# Patient Record
Sex: Male | Born: 1935 | Race: White | Hispanic: No | Marital: Married | Smoking: Former smoker
Health system: Southern US, Community
[De-identification: ages and names within clinical notes are randomized; demographics above are authoritative.]

## PROBLEM LIST (undated history)

## (undated) DIAGNOSIS — F419 Anxiety disorder, unspecified: Secondary | ICD-10-CM

## (undated) DIAGNOSIS — E119 Type 2 diabetes mellitus without complications: Secondary | ICD-10-CM

## (undated) DIAGNOSIS — E785 Hyperlipidemia, unspecified: Secondary | ICD-10-CM

## (undated) DIAGNOSIS — I10 Essential (primary) hypertension: Secondary | ICD-10-CM

## (undated) DIAGNOSIS — C61 Malignant neoplasm of prostate: Secondary | ICD-10-CM

## (undated) DIAGNOSIS — F32A Depression, unspecified: Secondary | ICD-10-CM

## (undated) DIAGNOSIS — F329 Major depressive disorder, single episode, unspecified: Secondary | ICD-10-CM

## (undated) DIAGNOSIS — D649 Anemia, unspecified: Secondary | ICD-10-CM

## (undated) DIAGNOSIS — G629 Polyneuropathy, unspecified: Secondary | ICD-10-CM

## (undated) HISTORY — DX: Anemia, unspecified: D64.9

## (undated) HISTORY — DX: Anxiety disorder, unspecified: F41.9

## (undated) HISTORY — DX: Depression, unspecified: F32.A

## (undated) HISTORY — DX: Hyperlipidemia, unspecified: E78.5

## (undated) HISTORY — DX: Malignant neoplasm of prostate: C61

## (undated) HISTORY — DX: Essential (primary) hypertension: I10

## (undated) HISTORY — DX: Polyneuropathy, unspecified: G62.9

## (undated) HISTORY — DX: Major depressive disorder, single episode, unspecified: F32.9

## (undated) HISTORY — DX: Type 2 diabetes mellitus without complications: E11.9

---

## 1973-03-12 HISTORY — PX: CHOLECYSTECTOMY: SHX55

## 2000-02-14 ENCOUNTER — Other Ambulatory Visit: Admission: RE | Admit: 2000-02-14 | Discharge: 2000-02-14 | Payer: Self-pay | Admitting: Urology

## 2000-03-12 HISTORY — PX: PROSTATE SURGERY: SHX751

## 2000-04-03 ENCOUNTER — Ambulatory Visit (HOSPITAL_COMMUNITY): Admission: RE | Admit: 2000-04-03 | Discharge: 2000-04-03 | Payer: Self-pay | Admitting: Gastroenterology

## 2000-08-28 ENCOUNTER — Other Ambulatory Visit: Admission: RE | Admit: 2000-08-28 | Discharge: 2000-08-28 | Payer: Self-pay | Admitting: Urology

## 2000-09-09 ENCOUNTER — Ambulatory Visit: Admission: RE | Admit: 2000-09-09 | Discharge: 2000-10-09 | Payer: Self-pay | Admitting: Radiation Oncology

## 2000-10-10 ENCOUNTER — Ambulatory Visit: Admission: RE | Admit: 2000-10-10 | Discharge: 2001-01-08 | Payer: Self-pay | Admitting: Radiation Oncology

## 2000-12-20 ENCOUNTER — Encounter: Payer: Self-pay | Admitting: Urology

## 2000-12-25 ENCOUNTER — Encounter: Admission: RE | Admit: 2000-12-25 | Discharge: 2000-12-25 | Payer: Self-pay | Admitting: Urology

## 2000-12-25 ENCOUNTER — Encounter: Payer: Self-pay | Admitting: Urology

## 2001-01-17 ENCOUNTER — Ambulatory Visit (HOSPITAL_COMMUNITY): Admission: RE | Admit: 2001-01-17 | Discharge: 2001-01-17 | Payer: Self-pay | Admitting: Urology

## 2001-02-10 ENCOUNTER — Ambulatory Visit: Admission: RE | Admit: 2001-02-10 | Discharge: 2001-05-11 | Payer: Self-pay | Admitting: Radiation Oncology

## 2001-04-28 ENCOUNTER — Ambulatory Visit (HOSPITAL_COMMUNITY): Admission: RE | Admit: 2001-04-28 | Discharge: 2001-04-28 | Payer: Self-pay | Admitting: Gastroenterology

## 2001-05-13 ENCOUNTER — Ambulatory Visit: Admission: RE | Admit: 2001-05-13 | Discharge: 2001-08-11 | Payer: Self-pay | Admitting: Radiation Oncology

## 2004-01-26 ENCOUNTER — Ambulatory Visit: Payer: Self-pay | Admitting: Hematology & Oncology

## 2004-09-18 ENCOUNTER — Ambulatory Visit: Payer: Self-pay | Admitting: Hematology & Oncology

## 2004-09-28 ENCOUNTER — Emergency Department (HOSPITAL_COMMUNITY): Admission: EM | Admit: 2004-09-28 | Discharge: 2004-09-28 | Payer: Self-pay | Admitting: Emergency Medicine

## 2011-12-21 ENCOUNTER — Other Ambulatory Visit: Payer: Self-pay | Admitting: Gastroenterology

## 2014-03-12 HISTORY — PX: CATARACT EXTRACTION: SUR2

## 2014-06-24 ENCOUNTER — Ambulatory Visit (INDEPENDENT_AMBULATORY_CARE_PROVIDER_SITE_OTHER): Payer: Medicare Other | Admitting: Podiatry

## 2014-06-24 ENCOUNTER — Ambulatory Visit (INDEPENDENT_AMBULATORY_CARE_PROVIDER_SITE_OTHER): Payer: Medicare Other

## 2014-06-24 ENCOUNTER — Encounter: Payer: Self-pay | Admitting: Podiatry

## 2014-06-24 VITALS — BP 154/71 | HR 73 | Resp 11 | Ht 71.0 in | Wt 155.0 lb

## 2014-06-24 DIAGNOSIS — M2040 Other hammer toe(s) (acquired), unspecified foot: Secondary | ICD-10-CM

## 2014-06-24 DIAGNOSIS — E1142 Type 2 diabetes mellitus with diabetic polyneuropathy: Secondary | ICD-10-CM

## 2014-06-24 DIAGNOSIS — L309 Dermatitis, unspecified: Secondary | ICD-10-CM

## 2014-06-24 MED ORDER — TRIAMCINOLONE ACETONIDE 0.025 % EX CREA: 1.0000 "application " | TOPICAL_CREAM | Freq: Two times a day (BID) | CUTANEOUS | Status: DC

## 2014-06-24 NOTE — Progress Notes (Signed)
   Subjective:    Patient ID: Clayton Richards, male    DOB: 1928-12-25, 81 y.o.   MRN: [id number]  HPI Comments: Pt complains of non-itching red, peeling rash to left plantar foot.     Review of Systems  All other systems reviewed and are negative.      Objective:   Physical Exam: He presents today vital signs stable alert and oriented 3 pulses are palpable bilateral. Slight decreased sensorium per Semmes-Weinstein monofilament. Deep tendon reflexes are intact bilateral and muscle strength +5 over 5 dorsiflexion plantar flexors and inverters and everters all intrinsic musculature is intact. Orthopedic evaluation demonstrates all joints distal to the ankle have full range of motion without crepitation. Radiographs demonstrate mild osteoarthritis midfoot bilateral. Also medial calcific sclerosis. Cutaneous evaluation demonstrates dry xerotic scaly skin to the plantar forefoot left appears to be an eczematous dermatitis also he has a fifth toenail plate that appears to be involved as well. His nails are thick yellow dystrophic onychomycotic and tender on palpation as well as debridement.        Assessment & Plan:  Assessment: Diabetes mellitus with diabetic peripheral neuropathy and dermatitis.  Plan: Wrote a prescription for triamcinolone cream. We'll follow-up with him as needed.

## 2015-12-21 ENCOUNTER — Other Ambulatory Visit: Payer: Self-pay | Admitting: Physician Assistant

## 2016-08-08 ENCOUNTER — Ambulatory Visit (INDEPENDENT_AMBULATORY_CARE_PROVIDER_SITE_OTHER): Payer: Medicare Other | Admitting: Neurology

## 2016-08-08 ENCOUNTER — Encounter (INDEPENDENT_AMBULATORY_CARE_PROVIDER_SITE_OTHER): Payer: Self-pay

## 2016-08-08 ENCOUNTER — Encounter: Payer: Self-pay | Admitting: Neurology

## 2016-08-08 VITALS — Ht 71.0 in | Wt 150.6 lb

## 2016-08-08 DIAGNOSIS — M21371 Foot drop, right foot: Secondary | ICD-10-CM | POA: Diagnosis not present

## 2016-08-08 DIAGNOSIS — M6281 Muscle weakness (generalized): Secondary | ICD-10-CM

## 2016-08-08 DIAGNOSIS — E119 Type 2 diabetes mellitus without complications: Secondary | ICD-10-CM | POA: Insufficient documentation

## 2016-08-08 DIAGNOSIS — M21372 Foot drop, left foot: Secondary | ICD-10-CM | POA: Diagnosis not present

## 2016-08-08 DIAGNOSIS — M48062 Spinal stenosis, lumbar region with neurogenic claudication: Secondary | ICD-10-CM | POA: Diagnosis not present

## 2016-08-08 DIAGNOSIS — R200 Anesthesia of skin: Secondary | ICD-10-CM | POA: Diagnosis not present

## 2016-08-08 DIAGNOSIS — G608 Other hereditary and idiopathic neuropathies: Secondary | ICD-10-CM | POA: Insufficient documentation

## 2016-08-08 DIAGNOSIS — G629 Polyneuropathy, unspecified: Secondary | ICD-10-CM | POA: Diagnosis not present

## 2016-08-08 NOTE — Patient Instructions (Signed)
Remember to drink plenty of fluid, eat healthy meals and do not skip any meals. Try to eat protein with a every meal and eat a healthy snack such as fruit or nuts in between meals. Try to keep a regular sleep-wake schedule and try to exercise daily, particularly in the form of walking, 20-30 minutes a day, if you can.   As far as diagnostic testing: Lab testing today EMG/NCS to evaluate for myopathy or myositis MRI of the lumbar spine to evaluate for lumbar spinal stenosis  I would like to see you back in for emg/ncs , sooner if we need to. Please call us with any interim questions, concerns, problems, updates or refill requests.   Our phone number is [phone]. We also have an after hours call service for urgent matters and there is a physician on-call for urgent questions. For any emergencies you know to call 911 or go to the nearest emergency room   Myopathy Myopathy is a condition that causes muscles to be weak and dysfunctional. There are many different kinds of myopathies. Myopathies may be passed from parent to child (inherited), or they may be caused by external factors (acquired). Inherited myopathies may cause symptoms at birth or in early life. Acquired myopathies may start suddenly at any age. There is no cure for most myopathies. What are the causes? Causes of myopathy include:  Endocrine disorders, such as thyroid disease.  Metabolic disorders, which are usually inherited.  Infection or inflammation of the muscles. This is often triggered by viruses or because the body's defense system (immune system) is attacking the muscles.  Certain medicines, such as lipid-lowering medicines. In some cases, the cause is not known. What increases the risk? This condition is more likely to develop in:  People who have a family history of myopathy.  Women. What are the signs or symptoms? Symptoms of myopathy can range from mild to severe. The most common symptoms include muscle  weakness, cramps, stiffness, and spasms. These symptoms are usually felt close to the center of the body (proximal). Depending upon the type of myopathy, one muscle group may be more affected than others. In inherited myopathies, symptoms vary among family members. Other symptoms of myopathy include:  Muscle pain or tenderness.  Muscle weakness that gets progressively worse.  Fatigue.  Heart problems.  Trouble breathing.  Trouble swallowing. How is this diagnosed? This condition is diagnosed based on your medical history and tests, which may include:  Blood tests.  Removal of a small piece of muscle tissue to be tested (biopsy).  Electromyogram (EMG).  MRI.  Electrocardiogram (ECG).  Genetic testing. How is this treated? Treatment varies depending on the type of myopathy. Treatment may include over-the-counter or prescription medicines, such as disease-modifying antirheumatic drugs (DMARDs) or drugs that suppress the immune system. You may need physical therapy, a brace to stabilize your muscles, or surgery. Follow these instructions at home: If you have a brace:   Wear the brace as told by your health care provider. Remove it only as told by your health care provider.  Loosen the brace if any part of your body tingles, becomes numb, or turns cold and blue.  Do not let your brace get wet if it is not waterproof. If it is not waterproof, cover it with a watertight plastic bag when you take a bath or a shower.  Keep the brace clean.  Ask your health care provider when it is safe to drive if you are wearing a brace. General  instructions   Take over-the-counter and prescription medicines only as told by your health care provider.  Maintain a healthy weight. Follow instructions from your health care provider about eating, drinking, and physical activity.  If physical therapy is prescribed, do exercises as told by your health care provider or physical therapist.  Keep all  follow-up visits as told by your health care provider. This is important. Contact a health care provider if:  You have trouble managing your symptoms at home.  You have a fever. Get help right away if:  You develop breathing problems.  You develop chest pain. This information is not intended to replace advice given to you by your health care provider. Make sure you discuss any questions you have with your health care provider. Document Released: 02/16/2002 Document Revised: 08/04/2015 Document Reviewed: 09/08/2014 Elsevier Interactive Patient Education  2017 [place].   Spinal Stenosis Spinal stenosis occurs when the open space (spinal canal) between the bones of your spine (vertebrae) narrows, putting pressure on the spinal cord or nerves. What are the causes? This condition is caused by areas of bone pushing into the central canals of your vertebrae. This condition may be present at birth (congenital), or it may be caused by:  Arthritic deterioration of your vertebrae (spinal degeneration). This usually starts around age 41.  Injury or trauma to the spine.  Tumors in the spine.  Calcium deposits in the spine. What are the signs or symptoms? Symptoms of this condition include:  Pain in the neck or back that is generally worse with activities, particularly when standing and walking.  Numbness, tingling, hot or cold sensations, weakness, or weariness in your legs.  Pain going up and down the leg (sciatica).  Frequent episodes of falling.  A foot-slapping gait that leads to muscle weakness. In more serious cases, you may develop:  Problemspassing stool or passing urine.  Difficulty having sex.  Loss of feeling in part or all of your leg. Symptoms may come on slowly and get worse over time. How is this diagnosed? This condition is diagnosed based on your medical history and a physical exam. Tests will also be done, such as:  MRI.  CT scan.  X-ray. How is  this treated? Treatment for this condition often focuses on managing your pain and any other symptoms. Treatment may include:  Practicing good posture to lessen pressure on your nerves.  Exercising to strengthen muscles, build endurance, improve balance, and maintain good joint movement (range of motion).  Losing weight, if needed.  Taking medicines to reduce swelling, inflammation, or pain.  Assistive devices, such as a corset or brace. In some cases, surgery may be needed. The most common procedure is decompression laminectomy. This is done to remove excess bone that puts pressure on your nerve roots. Follow these instructions at home: Managing pain, stiffness, and swelling   Do all exercises and stretches as told by your health care provider.  Practice good posture. If you were given a brace or a corset, wear it as told by your health care provider.  Do not do any activities that cause pain. Ask your health care provider what activities are safe for you.  Do not lift anything that is heavier than 10 lb (4.5 kg) or the limit that your health care provider tells you.  Maintain a healthy weight. Talk with your health care provider if you need help losing weight.  If directed, apply heat to the affected area as often as told by your health  care provider. Use the heat source that your health care provider recommends, such as a moist heat pack or a heating pad.  Place a towel between your skin and the heat source.  Leave the heat on for 20-30 minutes.  Remove the heat if your skin turns bright red. This is especially important if you are not able to feel pain, heat, or cold. You may have a greater risk of getting burned. General instructions   Take over-the-counter and prescription medicines only as told by your health care provider.  Do not use any products that contain nicotine or tobacco, such as cigarettes and e-cigarettes. If you need help quitting, ask your health care  provider.  Eat a healthy diet. This includes plenty of fruits and vegetables, whole grains, and low-fat (lean) protein.  Keep all follow-up visits as told by your health care provider. This is important. Contact a health care provider if:  Your symptoms do not get better or they get worse.  You have a fever. Get help right away if:  You have new or worse pain in your neck or upper back.  You have severe pain that cannot be controlled with medicines.  You are dizzy.  You have vision problems, blurred vision, or double vision.  You have a severe headache that is worse when you stand.  You have nausea or you vomit.  You develop new or worse numbness or tingling in your back or legs.  You have pain, redness, swelling, or warmth in your arm or leg. Summary  Spinal stenosis occurs when the open space (spinal canal) between the bones of your spine (vertebrae) narrows. This narrowing puts pressure on the spinal cord or nerves.  Spinal stenosis can cause numbness, weakness, or pain in the neck, back, and legs.  This condition may be caused by a birth defect, arthritic deterioration of your vertebrae, injury, tumors, or calcium deposits.  This condition is usually diagnosed with MRIs, CT scans, and X-rays. This information is not intended to replace advice given to you by your health care provider. Make sure you discuss any questions you have with your health care provider. Document Released: 05/19/2003 Document Revised: 02/01/2016 Document Reviewed: 02/01/2016 Elsevier Interactive Patient Education  2017 [employer].

## 2016-08-08 NOTE — Progress Notes (Signed)
[facility]    Provider:  Dr Clayton Richards Referring Provider: Chesley Noon, MD Primary Care Physician:  Clayton Noon, MD  CC:  Peripheral neuropathy  HPI:  Clayton Richards is a 83 y.o. male here as a referral from Dr. Melford Richards for peripheral neuropathy. Patient has a past medical history of diabetes type 2(since 1988), hypertension, hyperlipidemia, migraines. Was diagnosed with neuropathy 15 years ago getting worse within the last year and weakness in hs legs. He has no pain at all. He has numbness in his feet but this isn't the significant problem. He feels his legs are weaker. Progressive weakness for over a year. Sloly progressive. He can go up and down steps holding onto the rail. But he has difficulty getting out of low seats without using his hands. His arms are fine, no weakness. Just his legs. He goes to the gym several times a week. He sees his doctor every 6 months. Numbness goes up to his shins. No dragging of his feet, more proximal weakness. No muscle pain. No muscle twitching or cramps. No bulbar symptoms, no ptosis or vision changes. No rashes. No family history of neuromuscular disorders or neuropathy. He has dry mouth but no dry eyes. No back or neck pain. He can't walk long distances without sitting, can;t stand still has to sit down. Leaning on a cart makes it better.   Reviewed notes, labs and imaging from outside physicians, which showed;  Hgba1c 6.8 04/16/2016  This is an 83 year old patient here for peripheral neuropathy. Personally reviewed primary care notes. Symptoms ongoing for more than a year. Associated peripheral edema. No history of chronic renal disease. Patient has diabetes. His disease course has been stable. Diabetic complications include peripheral neuropathy. Patient is on diabetic triple therapy. Also has hyperlipidemia. Reviewed notes and exam which showed normal head, ears, nose, throat, head, ears, eyes, cardiovascular, pulmonary,  neurologic, skin, psychiatric. Diabetic foot exam showed normal sensation on monofilament test bilaterally, skin normal without erythema or cyanosis or pallor. Patient was prescribed tramadol as needed. Also diagnosed with idiopathic trigeminal neuropathy. He has back pain, feels better to sit down.   Reviewed reports XR of the foot right and left:  3 views of the right foot in stress osseously mature foot rectus in nature appears to have had some work to the first metatarsal right foot. Mild hammertoe deformities are noted. No fractures identified.  3 views of the left foot there was trace osseously mature foot rectus in nature. Mild osteoarthritic hammertoes are noted left foot. No osseous abnormalities.  Review of Systems: Patient complains of symptoms per HPI as well as the following symptoms: numbness, weakness, no rash, no fever. Pertinent negatives and positives per HPI. All others negative.   Social History   Social History  . Marital status: Married    Spouse name: Clayton Richards  . Number of children: 3  . Years of education: 14   Occupational History  . Retired    Social History Main Topics  . Smoking status: Former Research scientist (life sciences)  . Smokeless tobacco: Never Used  . Alcohol use No  . Drug use: No  . Sexual activity: Not on file   Other Topics Concern  . Not on file   Social History Narrative   Lives at home w/ his wife   Right-handed   Caffeine: occasional decaf coffee    No family history on file.  Past Medical History:  Diagnosis Date  . Anemia   . Anxiety   .  Depression   . Diabetes mellitus without complication ([place])   . Hyperlipidemia   . Hypertension   . Neuropathy   . Prostate CA [facility])     Past Surgical History:  Procedure Laterality Date  . CATARACT EXTRACTION  2016  . CHOLECYSTECTOMY  1975  . PROSTATE SURGERY  2002    Current Outpatient Prescriptions  Medication Sig Dispense Refill  . amLODipine-atorvastatin (CADUET) 5-20 MG per tablet Take 1 tablet by  mouth daily.    Marland Kitchen aspirin 81 MG tablet Take 81 mg by mouth daily.    Marland Kitchen buPROPion (WELLBUTRIN XL) 300 MG 24 hr tablet Take 300 mg by mouth daily.    . calcium carbonate (OS-CAL) 600 MG TABS tablet Take 600 mg by mouth 2 (two) times daily with a meal.    . Cholecalciferol (D3 ADULT PO) Take by mouth.    . ferrous sulfate 325 (65 FE) MG tablet Take 325 mg by mouth daily with breakfast.    . glimepiride (AMARYL) 4 MG tablet Take 4 mg by mouth daily with breakfast.    . metFORMIN (GLUCOPHAGE) 1000 MG tablet Take 1,000 mg by mouth 2 (two) times daily with a meal.    . Omega-3 Fatty Acids (OMEGA-3 FISH OIL PO) Take by mouth.    . sitaGLIPtin (JANUVIA) 50 MG tablet Take 50 mg by mouth daily.    Marland Kitchen triamcinolone (KENALOG) 0.025 % cream Apply 1 application topically 2 (two) times daily. 30 g 3  . valsartan-hydrochlorothiazide (DIOVAN-HCT) 320-12.5 MG per tablet Take 1 tablet by mouth daily.     No current facility-administered medications for this visit.     Allergies as of 08/08/2016  . (No Known Allergies)    Vitals: Ht 5' 11" (1.803 m)   Wt 150 lb 9.6 oz (68.3 kg)   BMI 21.00 kg/m  Last Weight:  Wt Readings from Last 1 Encounters:  08/08/16 150 lb 9.6 oz (68.3 kg)   Last Height:   Ht Readings from Last 1 Encounters:  08/08/16 5' 11" (1.803 m)    Physical exam: Exam: Gen: NAD, conversant                    CV: RRR, no MRG. No Carotid Bruits. No peripheral edema, warm, nontender Eyes: Conjunctivae clear without exudates or hemorrhage  Neuro: Detailed Neurologic Exam  Speech:    Speech is normal; fluent and spontaneous with normal comprehension.  Cognition:    The patient is oriented to person, place, and time;     recent and remote memory intact;     language fluent;     normal attention, concentration,     fund of knowledge Cranial Nerves:    The pupils are equal, round, and reactive to light. Attempted fundoscopic exam could not visualize. Visual fields are full to finger  confrontation. Extraocular movements are intact. Trigeminal sensation is intact and the muscles of mastication are normal. The face is symmetric. The palate elevates in the midline. Hearing intact. Voice is normal. Shoulder shrug is normal. The tongue has normal motion without fasciculations.   Coordination:    No dysmetria  Gait:    Wide based and mildly stoooped with a foot slapping gait  Motor Observation:    no involuntary movements noted. Tone:    Decreased muscle tone distal lower extremities  Posture:    Slightly stooped    Strength: Triceps and hip flexors: 4/5 bilaterally. foot DF: left 3/5, right 2+/5. mild foot inversion and eversion weakness  Otherwise strength is V/V in the upper and lower limbs.      Sensation: dec pinprick to mid calf, , absent vibration to the knees, absent proprioception at the great toes.      Reflex Exam:  DTR's:    Absent AJs otherwise deep tendon reflexes in the upper and lower extremities are normal bilaterally.   Toes:    The toes are downgoing bilaterally.   Clonus:    Clonus is absent.      Assessment/Plan:  83 year old male with 15 years of distal peripheral polyneuropathy now with reported proximal weakness.  Exam shows proximal upper and lower extremity weakness, distal distal LE weakness with bilateral foot drop, and decrease in all sensory modalities distally in the LE. The distal polyneuropathy may be diabetic in etiology however we'll perform a thorough serum screen for other common causes of treatable length dependent polyneuropathy. The distal weakness in his LE may be secondary to his polyneuropathy but need to consider other causes such as lumbar spinal stenosis given his neurogenic claudication. For his proximal weakness need to rule out myopathy or myositis, myasthenia gravis and other etiologies . Will check muscle enzymes as well.   Emg/ncs of the right arm and right leg for evaluation of myopathy/myositis or other  etiologies of proximal and distal weakness MRI lumbar spine to evaluate for spinal stenosis and surgical options Lab testing for causes of length-dependent sensorimotor neuropathy  Cc:  Clayton Noon, MD   Orders Placed This Encounter  Procedures  . MR LUMBAR SPINE WO CONTRAST  . Sjogren's syndrome antibods(ssa + ssb)  . B12 and Folate Panel  . Heavy metals, blood  . Vitamin B6  . Multiple Myeloma Panel (SPEP&IFE w/QIG)  . B. burgdorfi Antibody  . Vitamin B1  . Methylmalonic acid, serum  . CK  . Acetylcholine receptor, binding  . Acetylcholine receptor, blocking  . Acetylcholine receptor, modulating  . VGCC Antibody  . TSH  . NCV with EMG(electromyography)    Sarina Ill, MD  [facility] [address] [place], [place] [address]  Phone [phone] Fax [phone]

## 2016-08-14 LAB — VGCC ANTIBODY: VGCC Antibody: NEGATIVE

## 2016-08-14 LAB — MULTIPLE MYELOMA PANEL, SERUM
ALPHA2 GLOB SERPL ELPH-MCNC: 1 g/dL (ref 0.4–1.0)
Albumin SerPl Elph-Mcnc: 3.7 g/dL (ref 2.9–4.4)
Albumin/Glob SerPl: 1.1 (ref 0.7–1.7)
Alpha 1: 0.2 g/dL (ref 0.0–0.4)
B-Globulin SerPl Elph-Mcnc: 1.1 g/dL (ref 0.7–1.3)
Gamma Glob SerPl Elph-Mcnc: 1.1 g/dL (ref 0.4–1.8)
Globulin, Total: 3.5 g/dL (ref 2.2–3.9)
IGG (IMMUNOGLOBIN G), SERUM: 1149 mg/dL (ref 700–1600)
IgA/Immunoglobulin A, Serum: 211 mg/dL (ref 61–437)
IgM (Immunoglobulin M), Srm: 29 mg/dL (ref 15–143)
Total Protein: 7.2 g/dL (ref 6.0–8.5)

## 2016-08-14 LAB — B12 AND FOLATE PANEL
FOLATE: 14.1 ng/mL (ref >3.0)
VITAMIN B 12: 887 pg/mL (ref 232–1245)

## 2016-08-14 LAB — METHYLMALONIC ACID, SERUM: METHYLMALONIC ACID: 179 nmol/L (ref 0–378)

## 2016-08-14 LAB — ACETYLCHOLINE RECEPTOR, BLOCKING: Acetylchol Block Ab: 16 % (ref 0–25)

## 2016-08-14 LAB — HEAVY METALS, BLOOD
Arsenic: 7 ug/L (ref 2–23)
LEAD, BLOOD: 1 ug/dL (ref 0–19)
Mercury: NOT DETECTED ug/L (ref 0.0–14.9)

## 2016-08-14 LAB — CK: Total CK: 103 U/L (ref 24–204)

## 2016-08-14 LAB — VITAMIN B6: Vitamin B6: 8.3 ug/L (ref 5.3–46.7)

## 2016-08-14 LAB — TSH: TSH: 1.71 u[IU]/mL (ref 0.450–4.500)

## 2016-08-14 LAB — ACETYLCHOLINE RECEPTOR, MODULATING: Acetylcholine Modulat Ab: <12 % (ref 0–20)

## 2016-08-14 LAB — VITAMIN B1: THIAMINE: 113.4 nmol/L (ref 66.5–200.0)

## 2016-08-14 LAB — B. BURGDORFI ANTIBODIES

## 2016-08-14 LAB — SJOGREN'S SYNDROME ANTIBODS(SSA + SSB)

## 2016-08-14 LAB — ACETYLCHOLINE RECEPTOR, BINDING

## 2016-08-21 ENCOUNTER — Other Ambulatory Visit: Payer: Self-pay | Admitting: Neurology

## 2016-08-21 ENCOUNTER — Ambulatory Visit
Admission: RE | Admit: 2016-08-21 | Discharge: 2016-08-21 | Disposition: A | Discharge status: Home / Self Care | Payer: Medicare Other | Source: Ambulatory Visit | Attending: Neurology | Admitting: Neurology

## 2016-08-21 DIAGNOSIS — R200 Anesthesia of skin: Secondary | ICD-10-CM

## 2016-08-21 DIAGNOSIS — M48062 Spinal stenosis, lumbar region with neurogenic claudication: Secondary | ICD-10-CM | POA: Diagnosis not present

## 2016-08-21 DIAGNOSIS — G629 Polyneuropathy, unspecified: Secondary | ICD-10-CM

## 2016-08-21 DIAGNOSIS — M3500 Sicca syndrome, unspecified: Secondary | ICD-10-CM

## 2016-08-21 DIAGNOSIS — M21372 Foot drop, left foot: Secondary | ICD-10-CM

## 2016-08-21 DIAGNOSIS — M21371 Foot drop, right foot: Secondary | ICD-10-CM

## 2016-08-21 DIAGNOSIS — M6281 Muscle weakness (generalized): Secondary | ICD-10-CM

## 2016-08-22 ENCOUNTER — Other Ambulatory Visit: Payer: Self-pay | Admitting: Neurology

## 2016-08-22 DIAGNOSIS — M48062 Spinal stenosis, lumbar region with neurogenic claudication: Secondary | ICD-10-CM

## 2016-08-23 ENCOUNTER — Telehealth: Payer: Self-pay | Admitting: Neurology

## 2016-08-23 NOTE — Telephone Encounter (Signed)
Called Dr. Estanislado Pandy  Office and her assistant relayed to fax notes and labs and Dr. Estanislado Pandy would determine the urgency of patient coming in she reviews all her records referral has been sent. Telephone [phone] - fax [phone]. Marland Kitchen     Please refer to Rheumatology (preferrably Dr. Patrecia Pour  for Sjogren's Syndrome antibidoes for ssa and ssb both significantly elevated > 8. Please send my office note and very important send Sjogern's labs results and all labs. Hinton Dyer, would you call and see when Dr. Keturah Barre has availability or if we can get him in sooner with another rheumatologist? His antibodies are really high and he is having difficulty swallowing thanks.

## 2016-08-23 NOTE — Telephone Encounter (Signed)
That's fine. thanks

## 2016-09-13 ENCOUNTER — Encounter: Payer: Medicare Other | Admitting: Neurology

## 2016-09-14 ENCOUNTER — Telehealth: Payer: Self-pay | Admitting: Neurology

## 2016-09-14 NOTE — Telephone Encounter (Signed)
Patient called office in reference to being referred to rheumatologist.  Patient states he has not heard anything about an appointment.  Please call

## 2016-09-14 NOTE — Telephone Encounter (Signed)
Clayton Richards, Can we refer him to anohter rhaumatologist? No idea why she declined him, make sure we send his Sjogren's labs along with the referral showing Sjogren's disease.

## 2016-09-14 NOTE — Telephone Encounter (Signed)
Per notes, referral was declined per Dr. Estanislado Pandy.

## 2016-09-18 NOTE — Telephone Encounter (Signed)
Called Dr. Melissa Noon office telephone . Telephone [phone] - fax [phone]. Dr. Amil Amen can take patient on referral faxed with labs.

## 2016-09-26 ENCOUNTER — Other Ambulatory Visit: Payer: Self-pay | Admitting: Neurosurgery

## 2016-10-01 ENCOUNTER — Telehealth: Payer: Self-pay

## 2016-10-01 NOTE — Telephone Encounter (Signed)
Received 09/24/16 consult notes from [facility]. Dr. Arneta Cliche says, "Given the fact that he has weakness in his legs... And the severity of his stenosis, I do not think there is any other choice other than to offer him a decompression and, unfortunately, a possible fusion secondary to the severe facet arthropathy that he has on the left side at the 4-5 joint. I would do a limited amount of work... He understands and will contact me to let me know what it is that he would like to do." Sent to med records for scanning, copy to Dr. Jaynee Eagles for review.

## 2016-10-25 NOTE — Telephone Encounter (Signed)
Received faxed progress notes from [facility]. Dr. Trudie Reed advised that a positive lab (ssa + ssb) does not necessarily mean he has Sjogren syndrome but it's possible w/ his dry mouth... Advised that it would be difficult to know if neuropathy is r/t Sjogren's and he is not having any significant symptoms r/t this... Do not think it would be beneficial for him to have a nerve biopsy or even consider the significant immunosuppression that would be required to treat a neuropathy from Sjogren's." Sent to med records for scanning, copy to Dr. Jaynee Eagles for review.

## 2016-11-07 ENCOUNTER — Inpatient Hospital Stay: Admit: 2016-11-07 | Payer: Medicare Other | Admitting: Neurosurgery

## 2016-11-07 SURGERY — POSTERIOR LUMBAR FUSION 1 LEVEL
Anesthesia: General

## 2017-01-22 ENCOUNTER — Telehealth: Payer: Self-pay | Admitting: Hematology and Oncology

## 2017-01-22 ENCOUNTER — Encounter: Payer: Self-pay | Admitting: Hematology and Oncology

## 2017-01-22 NOTE — Telephone Encounter (Addendum)
Appt has been scheduled for the pt to see Dr. Lebron Conners on 12/3 at 2pm. Pt aware to arrive 30 minutes early. Address and insurance verified. Letter mailed to the pt.

## 2017-01-22 NOTE — Telephone Encounter (Deleted)
Correction pt is scheduled for 12/5, not 12/3

## 2017-02-13 ENCOUNTER — Ambulatory Visit: Payer: Medicare Other | Admitting: Hematology and Oncology

## 2017-02-13 ENCOUNTER — Ambulatory Visit (HOSPITAL_BASED_OUTPATIENT_CLINIC_OR_DEPARTMENT_OTHER): Payer: Medicare Other

## 2017-02-13 ENCOUNTER — Telehealth: Payer: Self-pay | Admitting: Hematology and Oncology

## 2017-02-13 ENCOUNTER — Encounter: Payer: Self-pay | Admitting: Hematology and Oncology

## 2017-02-13 DIAGNOSIS — D696 Thrombocytopenia, unspecified: Secondary | ICD-10-CM

## 2017-02-13 DIAGNOSIS — I10 Essential (primary) hypertension: Secondary | ICD-10-CM | POA: Diagnosis not present

## 2017-02-13 DIAGNOSIS — D72819 Decreased white blood cell count, unspecified: Secondary | ICD-10-CM

## 2017-02-13 DIAGNOSIS — G629 Polyneuropathy, unspecified: Secondary | ICD-10-CM

## 2017-02-13 DIAGNOSIS — D72810 Lymphocytopenia: Secondary | ICD-10-CM | POA: Diagnosis not present

## 2017-02-13 DIAGNOSIS — D649 Anemia, unspecified: Secondary | ICD-10-CM | POA: Diagnosis not present

## 2017-02-13 DIAGNOSIS — E119 Type 2 diabetes mellitus without complications: Secondary | ICD-10-CM | POA: Diagnosis not present

## 2017-02-13 DIAGNOSIS — M3500 Sicca syndrome, unspecified: Secondary | ICD-10-CM

## 2017-02-13 DIAGNOSIS — R209 Unspecified disturbances of skin sensation: Secondary | ICD-10-CM

## 2017-02-13 LAB — COMPREHENSIVE METABOLIC PANEL
ALT: 24 U/L (ref 0–55)
ANION GAP: 13 meq/L — AB (ref 3–11)
AST: 29 U/L (ref 5–34)
Albumin: 3.7 g/dL (ref 3.5–5.0)
Alkaline Phosphatase: 71 U/L (ref 40–150)
BILIRUBIN TOTAL: 0.46 mg/dL (ref 0.20–1.20)
BUN: 25.1 mg/dL (ref 7.0–26.0)
CHLORIDE: 107 meq/L (ref 98–109)
CO2: 19 meq/L — AB (ref 22–29)
Calcium: 9.7 mg/dL (ref 8.4–10.4)
Creatinine: 1.5 mg/dL — ABNORMAL HIGH (ref 0.7–1.3)
EGFR: 43 mL/min/{1.73_m2} — AB (ref >60)
Glucose: 64 mg/dl — ABNORMAL LOW (ref 70–140)
POTASSIUM: 4.6 meq/L (ref 3.5–5.1)
Sodium: 139 mEq/L (ref 136–145)
Total Protein: 7.6 g/dL (ref 6.4–8.3)

## 2017-02-13 LAB — URIC ACID: URIC ACID, SERUM: 8.5 mg/dL — AB (ref 2.6–7.4)

## 2017-02-13 LAB — MORPHOLOGY: PLT EST: ADEQUATE

## 2017-02-13 LAB — CBC & DIFF AND RETIC
BASO%: 0.3 % (ref 0.0–2.0)
Basophils Absolute: 0 10*3/uL (ref 0.0–0.1)
EOS ABS: 0 10*3/uL (ref 0.0–0.5)
EOS%: 0 % (ref 0.0–7.0)
HCT: 30.1 % — ABNORMAL LOW (ref 38.4–49.9)
HEMOGLOBIN: 9.3 g/dL — AB (ref 13.0–17.1)
IMMATURE RETIC FRACT: 19.8 % — AB (ref 3.00–10.60)
LYMPH#: 0.7 10*3/uL — AB (ref 0.9–3.3)
LYMPH%: 22 % (ref 14.0–49.0)
MCH: 29.1 pg (ref 27.2–33.4)
MCHC: 30.9 g/dL — ABNORMAL LOW (ref 32.0–36.0)
MCV: 94.1 fL (ref 79.3–98.0)
MONO#: 0.8 10*3/uL (ref 0.1–0.9)
MONO%: 26 % — ABNORMAL HIGH (ref 0.0–14.0)
NEUT%: 51.7 % (ref 39.0–75.0)
NEUTROS ABS: 1.5 10*3/uL (ref 1.5–6.5)
Platelets: 229 10*3/uL (ref 140–400)
RBC: 3.2 10*6/uL — AB (ref 4.20–5.82)
RDW: 16.7 % — ABNORMAL HIGH (ref 11.0–14.6)
RETIC CT ABS: 64 10*3/uL (ref 34.80–93.90)
Retic %: 2 % — ABNORMAL HIGH (ref 0.80–1.80)
WBC: 3 10*3/uL — AB (ref 4.0–10.3)

## 2017-02-13 LAB — LACTATE DEHYDROGENASE: LDH: 216 U/L (ref 125–245)

## 2017-02-13 NOTE — Telephone Encounter (Signed)
Gave calendar for January 2019

## 2017-02-14 LAB — IRON AND TIBC
%SAT: 13 % — AB (ref 20–55)
IRON: 40 ug/dL — AB (ref 42–163)
TIBC: 318 ug/dL (ref 202–409)
UIBC: 277 ug/dL (ref 117–376)

## 2017-02-14 LAB — CERULOPLASMIN: CERULOPLASMIN: 30.1 mg/dL (ref 16.0–31.0)

## 2017-02-14 LAB — TESTOSTERONE, FREE, TOTAL, SHBG
SEX HORMONE BINDING: 157.6 nmol/L — AB (ref 19.3–76.4)
TESTOSTERONE FREE: 1.2 pg/mL — AB (ref 6.6–18.1)
Testosterone, Serum: 31 ng/dL — ABNORMAL LOW (ref 264–916)

## 2017-02-14 LAB — TSH: TSH: 2.172 m[IU]/L (ref 0.320–4.118)

## 2017-02-14 LAB — FOLATE: Folate: 13.1 ng/mL (ref >3.0)

## 2017-02-14 LAB — ERYTHROPOIETIN: ERYTHROPOIETIN: 22.8 m[IU]/mL — AB (ref 2.6–18.5)

## 2017-02-14 LAB — VITAMIN B12: Vitamin B12: >2000 pg/mL — ABNORMAL HIGH (ref 232–1245)

## 2017-02-14 LAB — FERRITIN: Ferritin: 140 ng/ml (ref 22–316)

## 2017-02-15 LAB — PROTEIN ELECTROPHORESIS, SERUM, WITH REFLEX
A/G RATIO SPE: 0.9 (ref 0.7–1.7)
ALBUMIN: 3.4 g/dL (ref 2.9–4.4)
Alpha 1: 0.3 g/dL (ref 0.0–0.4)
Alpha 2: 1.2 g/dL — ABNORMAL HIGH (ref 0.4–1.0)
Beta: 1.2 g/dL (ref 0.7–1.3)
GAMMA GLOBULIN: 1.2 g/dL (ref 0.4–1.8)
Globulin, Total: 3.8 g/dL (ref 2.2–3.9)
TOTAL PROTEIN: 7.2 g/dL (ref 6.0–8.5)

## 2017-02-15 LAB — COPPER, SERUM: COPPER: 123 ug/dL (ref 72–166)

## 2017-02-16 LAB — METHYLMALONIC ACID, SERUM: Methylmalonic Acid, Serum: 172 nmol/L (ref 0–378)

## 2017-02-17 LAB — VITAMIN B6: Vitamin B6: 3.9 ug/L — ABNORMAL LOW (ref 5.3–46.7)

## 2017-03-05 ENCOUNTER — Other Ambulatory Visit: Payer: Self-pay | Admitting: Radiology

## 2017-03-07 ENCOUNTER — Encounter (HOSPITAL_COMMUNITY): Payer: Self-pay

## 2017-03-07 ENCOUNTER — Ambulatory Visit (HOSPITAL_COMMUNITY)
Admission: RE | Admit: 2017-03-07 | Discharge: 2017-03-07 | Disposition: A | Discharge status: Home / Self Care | Payer: Medicare Other | Source: Ambulatory Visit | Attending: Hematology and Oncology | Admitting: Hematology and Oncology

## 2017-03-07 DIAGNOSIS — Z9889 Other specified postprocedural states: Secondary | ICD-10-CM | POA: Diagnosis not present

## 2017-03-07 DIAGNOSIS — F419 Anxiety disorder, unspecified: Secondary | ICD-10-CM | POA: Diagnosis not present

## 2017-03-07 DIAGNOSIS — I10 Essential (primary) hypertension: Secondary | ICD-10-CM | POA: Insufficient documentation

## 2017-03-07 DIAGNOSIS — F329 Major depressive disorder, single episode, unspecified: Secondary | ICD-10-CM | POA: Insufficient documentation

## 2017-03-07 DIAGNOSIS — E1140 Type 2 diabetes mellitus with diabetic neuropathy, unspecified: Secondary | ICD-10-CM | POA: Insufficient documentation

## 2017-03-07 DIAGNOSIS — D7589 Other specified diseases of blood and blood-forming organs: Secondary | ICD-10-CM | POA: Diagnosis not present

## 2017-03-07 DIAGNOSIS — D649 Anemia, unspecified: Secondary | ICD-10-CM | POA: Diagnosis present

## 2017-03-07 DIAGNOSIS — D72810 Lymphocytopenia: Secondary | ICD-10-CM | POA: Diagnosis not present

## 2017-03-07 DIAGNOSIS — D72819 Decreased white blood cell count, unspecified: Secondary | ICD-10-CM | POA: Insufficient documentation

## 2017-03-07 DIAGNOSIS — E785 Hyperlipidemia, unspecified: Secondary | ICD-10-CM | POA: Insufficient documentation

## 2017-03-07 DIAGNOSIS — Z8546 Personal history of malignant neoplasm of prostate: Secondary | ICD-10-CM | POA: Diagnosis not present

## 2017-03-07 DIAGNOSIS — Z9049 Acquired absence of other specified parts of digestive tract: Secondary | ICD-10-CM | POA: Diagnosis not present

## 2017-03-07 DIAGNOSIS — Z87891 Personal history of nicotine dependence: Secondary | ICD-10-CM | POA: Insufficient documentation

## 2017-03-07 LAB — CBC WITH DIFFERENTIAL/PLATELET
BASOS PCT: 0 %
Basophils Absolute: 0 10*3/uL (ref 0.0–0.1)
EOS PCT: 0 %
Eosinophils Absolute: 0 10*3/uL (ref 0.0–0.7)
HEMATOCRIT: 28.6 % — AB (ref 39.0–52.0)
HEMOGLOBIN: 9.1 g/dL — AB (ref 13.0–17.0)
LYMPHS PCT: 24 %
Lymphs Abs: 0.8 10*3/uL (ref 0.7–4.0)
MCH: 29.7 pg (ref 26.0–34.0)
MCHC: 31.8 g/dL (ref 30.0–36.0)
MCV: 93.5 fL (ref 78.0–100.0)
MONOS PCT: 31 %
Monocytes Absolute: 1 10*3/uL (ref 0.1–1.0)
NEUTROS ABS: 1.5 10*3/uL — AB (ref 1.7–7.7)
NEUTROS PCT: 45 %
PLATELETS: 247 10*3/uL (ref 150–400)
RBC: 3.06 MIL/uL — ABNORMAL LOW (ref 4.22–5.81)
RDW: 16.9 % — ABNORMAL HIGH (ref 11.5–15.5)
WBC: 3.3 10*3/uL — ABNORMAL LOW (ref 4.0–10.5)

## 2017-03-07 LAB — BASIC METABOLIC PANEL
Anion gap: 10 (ref 5–15)
BUN: 22 mg/dL — ABNORMAL HIGH (ref 6–20)
CALCIUM: 8.8 mg/dL — AB (ref 8.9–10.3)
CHLORIDE: 107 mmol/L (ref 101–111)
CO2: 19 mmol/L — AB (ref 22–32)
CREATININE: 1.25 mg/dL — AB (ref 0.61–1.24)
GFR calc Af Amer: 58 mL/min — ABNORMAL LOW (ref >60)
GFR calc non Af Amer: 50 mL/min — ABNORMAL LOW (ref >60)
GLUCOSE: 151 mg/dL — AB (ref 65–99)
Potassium: 4 mmol/L (ref 3.5–5.1)
Sodium: 136 mmol/L (ref 135–145)

## 2017-03-07 LAB — GLUCOSE, CAPILLARY: GLUCOSE-CAPILLARY: 127 mg/dL — AB (ref 65–99)

## 2017-03-07 LAB — PROTIME-INR
INR: 1.13
Prothrombin Time: 14.4 seconds (ref 11.4–15.2)

## 2017-03-07 MED ORDER — FENTANYL CITRATE (PF) 100 MCG/2ML IJ SOLN
INTRAMUSCULAR | Status: AC | PRN
  Administered 2017-03-07: 50 ug via INTRAVENOUS

## 2017-03-07 MED ORDER — LIDOCAINE HCL (PF) 1 % IJ SOLN
INTRAMUSCULAR | Status: AC | PRN
  Administered 2017-03-07: 20 mL

## 2017-03-07 MED ORDER — MIDAZOLAM HCL 2 MG/2ML IJ SOLN
INTRAMUSCULAR | Status: AC
  Filled 2017-03-07: qty 2

## 2017-03-07 MED ORDER — FENTANYL CITRATE (PF) 100 MCG/2ML IJ SOLN
INTRAMUSCULAR | Status: AC
  Filled 2017-03-07: qty 2

## 2017-03-07 MED ORDER — MIDAZOLAM HCL 2 MG/2ML IJ SOLN
INTRAMUSCULAR | Status: AC | PRN
  Administered 2017-03-07: 1 mg via INTRAVENOUS

## 2017-03-07 MED ORDER — SODIUM CHLORIDE 0.9 % IV SOLN
INTRAVENOUS | Status: DC
  Administered 2017-03-07: 09:00:00 via INTRAVENOUS

## 2017-03-07 NOTE — Procedures (Signed)
Pre-procedure Diagnosis: Anemia Post-procedure Diagnosis: Same  Technically successful CT guided bone marrow aspiration and biopsy of left iliac crest.   Complications: None Immediate  EBL: None  SignedSandi Mariscal Pager: [phone] 03/07/2017, 11:31 AM

## 2017-03-07 NOTE — Discharge Instructions (Signed)
Moderate Conscious Sedation, Adult, Care After °These instructions provide you with information about caring for yourself after your procedure. Your health care provider may also give you more specific instructions. Your treatment has been planned according to current medical practices, but problems sometimes occur. Call your health care provider if you have any problems or questions after your procedure. °What can I expect after the procedure? °After your procedure, it is common: °· To feel sleepy for several hours. °· To feel clumsy and have poor balance for several hours. °· To have poor judgment for several hours. °· To vomit if you eat too soon. ° °Follow these instructions at home: °For at least 24 hours after the procedure: ° °· Do not: °? Participate in activities where you could fall or become injured. °? Drive. °? Use heavy machinery. °? Drink alcohol. °? Take sleeping pills or medicines that cause drowsiness. °? Make important decisions or sign legal documents. °? Take care of children on your own. °· Rest. °Eating and drinking °· Follow the diet recommended by your health care provider. °· If you vomit: °? Drink water, juice, or soup when you can drink without vomiting. °? Make sure you have little or no nausea before eating solid foods. °General instructions °· Have a responsible adult stay with you until you are awake and alert. °· Take over-the-counter and prescription medicines only as told by your health care provider. °· If you smoke, do not smoke without supervision. °· Keep all follow-up visits as told by your health care provider. This is important. °Contact a health care provider if: °· You keep feeling nauseous or you keep vomiting. °· You feel light-headed. °· You develop a rash. °· You have a fever. °Get help right away if: °· You have trouble breathing. °This information is not intended to replace advice given to you by your health care provider. Make sure you discuss any questions you have  with your health care provider. °Document Released: 12/17/2012 Document Revised: 08/01/2015 Document Reviewed: 06/18/2015 °Elsevier Interactive Patient Education © 2018 [employer] ° ° °Bone Marrow Aspiration and Bone Marrow Biopsy, Adult, Care After °This sheet gives you information about how to care for yourself after your procedure. Your health care provider may also give you more specific instructions. If you have problems or questions, contact your health care provider. °What can I expect after the procedure? °After the procedure, it is common to have: °· Mild pain and tenderness. °· Swelling. °· Bruising. ° °Follow these instructions at home: °· Take over-the-counter or prescription medicines only as told by your health care provider. °· Do not take baths, swim, or use a hot tub until your health care provider approves. Ask if you can take a shower or have a sponge bath.  You may shower tomorrow. °· Follow instructions from your health care provider about how to take care of the puncture site. Make sure you: °? Wash your hands with soap and water before you change your bandage (dressing). If soap and water are not available, use hand sanitizer. °? Change your dressing as told by your health care provider.  You may remove your dressing tomorrow. °· Check your puncture site every day for signs of infection. Check for: °? More redness, swelling, or pain. °? More fluid or blood. °? Warmth. °? Pus or a bad smell. °· Return to your normal activities as told by your health care provider. Ask your health care provider what activities are safe for you. °· Do not drive   for 24 hours if you were given a medicine to help you relax (sedative). °· Keep all follow-up visits as told by your health care provider. This is important. °Contact a health care provider if: °· You have more redness, swelling, or pain around the puncture site. °· You have more fluid or blood coming from the puncture site. °· Your puncture site feels  warm to the touch. °· You have pus or a bad smell coming from the puncture site. °· You have a fever. °· Your pain is not controlled with medicine. °This information is not intended to replace advice given to you by your health care provider. Make sure you discuss any questions you have with your health care provider. °Document Released: 09/15/2004 Document Revised: 09/16/2015 Document Reviewed: 08/10/2015 °Elsevier Interactive Patient Education © 2018 [employer] ° °

## 2017-03-07 NOTE — H&P (Signed)
Chief Complaint: anemia  Referring Physician:Dr. Grace Isaac  Supervising Physician: Sandi Mariscal  Patient Status: [facility] - Out-pt  HPI: Clayton Richards is a 83 y.o. male who is being followed by Dr. Lebron Conners for anemia.  He has had fatigue for a while now and was diagnosed with Sjgren's recently.  His anemia has persisted.  He was referred to Dr. Lebron Conners who has recommended a bone marrow biopsy.  He presents today for this procedure.  He denies any recent chest pain, SOB, fevers, or chills.   Past Medical History:  Past Medical History:  Diagnosis Date  . Anemia   . Anxiety   . Depression   . Diabetes mellitus without complication ([place])   . Hyperlipidemia   . Hypertension   . Neuropathy   . Prostate CA [facility])     Past Surgical History:  Past Surgical History:  Procedure Laterality Date  . CATARACT EXTRACTION  2016  . CHOLECYSTECTOMY  1975  . PROSTATE SURGERY  2002    Family History: History reviewed. No pertinent family history.  Social History:  reports that he quit smoking about 31 years ago. he has never used smokeless tobacco. He reports that he does not drink alcohol or use drugs.  Allergies: No Known Allergies  Medications: Medications reviewed in epic  Please HPI for pertinent positives, otherwise complete 10 system ROS negative.  Mallampati Score: MD Evaluation Airway: WNL Heart: WNL Abdomen: WNL Chest/ Lungs: WNL ASA  Classification: 2 Mallampati/Airway Score: Two  Physical Exam: BP (!) 144/68   Pulse 87   Temp 98 F (36.7 C) (Oral)   Resp 18   Ht '5\' 10"'$  (1.778 m)   Wt 145 lb (65.8 kg)   SpO2 100%   BMI 20.81 kg/m  Body mass index is 20.81 kg/m. General: pleasant, WD, WN white male who is laying in bed in NAD HEENT: head is normocephalic, atraumatic.  Sclera are noninjected.  PERRL.  Ears and nose without any masses or lesions.  Mouth is pink and moist Heart: regular, rate, and rhythm.  Normal s1,s2. No obvious murmurs, gallops, or rubs  noted.  Palpable radial pulses bilaterally Lungs: CTAB, no wheezes, rhonchi, or rales noted.  Respiratory effort nonlabored Abd: soft, NT, ND, +BS, no masses, hernias, or organomegaly Psych: A&Ox3 with an appropriate affect.   Labs: Results for orders placed or performed during the hospital encounter of 03/07/17 (from the past 48 hour(s))  Basic metabolic panel     Status: Abnormal   Collection Time: 03/07/17  9:12 AM  Result Value Ref Range   Sodium 136 135 - 145 mmol/L   Potassium 4.0 3.5 - 5.1 mmol/L   Chloride 107 101 - 111 mmol/L   CO2 19 (L) 22 - 32 mmol/L   Glucose, Bld 151 (H) 65 - 99 mg/dL   BUN 22 (H) 6 - 20 mg/dL   Creatinine, Ser 1.25 (H) 0.61 - 1.24 mg/dL   Calcium 8.8 (L) 8.9 - 10.3 mg/dL   GFR calc non Af Amer 50 (L) >60 mL/min   GFR calc Af Amer 58 (L) >60 mL/min    Comment: (NOTE) The eGFR has been calculated using the CKD EPI equation. This calculation has not been validated in all clinical situations. eGFR's persistently <60 mL/min signify possible Chronic Kidney Disease.    Anion gap 10 5 - 15    Imaging: No results found.  Assessment/Plan 1. Anemia  Will proceed today with a bone marrow biopsy to hopefully determine etiology  of anemia.  Some of his labs are still pending.  Risks and benefits discussed with the patient including, but not limited to bleeding, infection, damage to adjacent structures or low yield requiring additional tests. All of the patient's questions were answered, patient is agreeable to proceed. Consent signed and in chart.  Thank you for this interesting consult.  I greatly enjoyed meeting Clayton Richards and look forward to participating in their care.  A copy of this report was sent to the requesting provider on this date.  Electronically Signed: Henreitta Cea 03/07/2017, 9:59 AM   I spent a total of  30 Minutes   in face to face in clinical consultation, greater than 50% of which was counseling/coordinating care for  anemia

## 2017-03-15 ENCOUNTER — Encounter: Payer: Self-pay | Admitting: Hematology and Oncology

## 2017-03-15 ENCOUNTER — Ambulatory Visit: Payer: Medicare Other | Admitting: Hematology and Oncology

## 2017-03-15 ENCOUNTER — Telehealth: Payer: Self-pay | Admitting: Hematology and Oncology

## 2017-03-15 VITALS — BP 145/54 | HR 117 | Temp 97.8°F | Resp 19 | Ht 70.0 in | Wt 145.2 lb

## 2017-03-15 DIAGNOSIS — E119 Type 2 diabetes mellitus without complications: Secondary | ICD-10-CM

## 2017-03-15 DIAGNOSIS — M3500 Sicca syndrome, unspecified: Secondary | ICD-10-CM | POA: Diagnosis not present

## 2017-03-15 DIAGNOSIS — I10 Essential (primary) hypertension: Secondary | ICD-10-CM | POA: Diagnosis not present

## 2017-03-15 DIAGNOSIS — D649 Anemia, unspecified: Secondary | ICD-10-CM | POA: Diagnosis not present

## 2017-03-15 DIAGNOSIS — D72810 Lymphocytopenia: Secondary | ICD-10-CM

## 2017-03-15 DIAGNOSIS — D696 Thrombocytopenia, unspecified: Secondary | ICD-10-CM | POA: Diagnosis not present

## 2017-03-15 NOTE — Telephone Encounter (Signed)
Gave avs and calendar for february °

## 2017-03-16 NOTE — Assessment & Plan Note (Signed)
84 y.o. male with diagnosis of Sjogren's syndrome referred to the clinic for bicytopenia with leukopenia and anemia.  Review of the hematological trends demonstrates progressive neutropenia and lymphopenia with associated normocytic normochromic anemia with trends towards macrocytosis.  Differential diagnosis is broad and includes development of myelodysplasia, acute myeloid leukemia, or development of systemic autoimmune condition such as systemic lupus. We will initiate our primary workup by repeating lab work as outlined below.  Plan: --Labs today as outlined below -- Bone marrow biopsy --Return to our clinic 2 weeks following bone marrow biopsy to review results.

## 2017-03-16 NOTE — Progress Notes (Signed)
Clayton Richards:  Assessment: Lymphopenia 84 y.o. male with diagnosis of Sjogren's syndrome referred to the clinic for bicytopenia with leukopenia and anemia.  Review of the hematological trends demonstrates progressive neutropenia and lymphopenia with associated normocytic normochromic anemia with trends towards macrocytosis.  Differential diagnosis is broad and includes development of myelodysplasia, acute myeloid leukemia, or development of systemic autoimmune condition such as systemic lupus. We will initiate our primary workup by repeating lab work as outlined below.  Plan: --Labs today as outlined below -- Bone marrow biopsy --Return to our clinic 2 weeks following bone marrow biopsy to review results.  Voice recognition software was used and creation of this note. Despite my best effort at editing the text, some misspelling/errors may have occurred.  Orders Placed This Encounter  Procedures  . CT Biopsy    Standing Status:   Future    Number of Occurrences:   1    Standing Expiration Date:   02/13/2018    Order Specific Question:   Lab orders requested (DO NOT place separate lab orders, these will be automatically ordered during procedure specimen collection):    Answer:   Cytology - Non Pap    Comments:   Cytogenetics, flow, MDS-FISH    Order Specific Question:   Lab orders requested (DO NOT place separate lab orders, these will be automatically ordered during procedure specimen collection):    Answer:   Surgical Pathology    Order Specific Question:   Lab orders requested (DO NOT place separate lab orders, these will be automatically ordered during procedure specimen collection):    Answer:   Other    Order Specific Question:   Reason for Exam (SYMPTOM  OR DIAGNOSIS REQUIRED)    Answer:   Bicytopenia, please eval for MDS    Order Specific Question:   Preferred imaging location?    Answer:   [facility]    Order Specific Question:   Radiology  Contrast Protocol - do NOT remove file path    Answer:   [email or web address]  . CT BONE MARROW BIOPSY & ASPIRATION    Standing Status:   Future    Number of Occurrences:   1    Standing Expiration Date:   05/15/2018    Order Specific Question:   Reason for Exam (SYMPTOM  OR DIAGNOSIS REQUIRED)    Answer:   bicytopenia, please eval for MDS    Order Specific Question:   Preferred imaging location?    Answer:   [facility]    Order Specific Question:   Radiology Contrast Protocol - do NOT remove file path    Answer:   [email or web address]  . CBC & Diff and Retic    Standing Status:   Future    Number of Occurrences:   1    Standing Expiration Date:   02/13/2018  . Morphology    Standing Status:   Future    Number of Occurrences:   1    Standing Expiration Date:   02/13/2018  . Comprehensive metabolic panel    Standing Status:   Future    Number of Occurrences:   1    Standing Expiration Date:   02/13/2018  . Lactate dehydrogenase (LDH)    Standing Status:   Future    Number of Occurrences:   1    Standing Expiration Date:   02/13/2018  . Uric acid    Standing Status:   Future    Number of Occurrences:  1    Standing Expiration Date:   02/13/2018  . Vitamin B12    Standing Status:   Future    Number of Occurrences:   1    Standing Expiration Date:   02/13/2018  . Folate, Serum    Standing Status:   Future    Number of Occurrences:   1    Standing Expiration Date:   02/13/2018  . Ferritin    Standing Status:   Future    Number of Occurrences:   1    Standing Expiration Date:   02/13/2018  . Iron and TIBC    Standing Status:   Future    Number of Occurrences:   1    Standing Expiration Date:   02/13/2018  . Erythropoietin    Standing Status:   Future    Number of Occurrences:   1    Standing Expiration Date:   02/13/2018  . Testosterone, free, total    Standing Status:   Future    Number of Occurrences:   1     Standing Expiration Date:   02/13/2018  . Vitamin B6    Standing Status:   Future    Number of Occurrences:   1    Standing Expiration Date:   02/13/2018  . SPEP with reflex to IFE    Standing Status:   Future    Number of Occurrences:   1    Standing Expiration Date:   02/13/2018  . Copper, serum    Standing Status:   Future    Number of Occurrences:   1    Standing Expiration Date:   02/13/2018  . Ceruloplasmin    Standing Status:   Future    Number of Occurrences:   1    Standing Expiration Date:   02/13/2018  . Methylmalonic acid, serum    Standing Status:   Future    Number of Occurrences:   1    Standing Expiration Date:   02/13/2018  . TSH    Standing Status:   Future    Number of Occurrences:   1    Standing Expiration Date:   02/13/2018    All questions were answered.  . The patient knows to call the clinic with any problems, questions or concerns.  This note was electronically signed.    History of Presenting Illness Clayton Richards 84 y.o. presenting to the [facility] for "abnormal blood chemistries", referred by Dr Anastasia Pall.  On additional review of the records, I suspect that referral was placed presence of progressive anemia and leukopenia.  Patient's past medical history is significant for history of prostate cancer at the age of 76 with 5 weeks of radiation and Lupron.  Over the past 3 years, patient has been having peripheral neuropathy with gradual progression. He also has been diagnosed with Sjogren's syndrome and is being followed by rheumatology.  Patient is a lifelong non-smoker and does not drink alcohol.  This time, patient denies fevers, chills, night sweats.  No recurrent infections.  Denies unexpected weight loss.  Continues to have peripheral neuropathy especially in the lower extremities.  No loss of strength or significant imbalance at this time.  Patient denies chest pain, shortness of breath, or cough.  No nausea, vomiting, abdominal pain, early  satiety, diarrhea, or constipation.  No dysuria or hematuria.  Oncological/hematological History: --Labs, 02/18/12: WBC 4.6, ANC 2.9, ALC 1.1, Mono 0.5, Hgb 10.8, MCV 88.0, MCH 28.1, RDW 15.1, Plt 339 --  Labs, 08/20/12: WBC 5.0, ANC 3.3, ALC 1.2, Mono 0.4, Hgb 11.3, MCV 86.0, MCH 28.4, RDW 15.1, Plt 337  --Labs, 04/16/16: WBC 2.8, ANC 1.7, ALC 0.8, Mono 0.4, Hgb 10.4, MCV 87.0, MCH 27.7, RDW 16.3, Plt 282 --Labs, 12/27/16: WBC 2.6, ANC 1.6, ALC 0.4, Mono 0.4, Hgb   9.8, MCV 93.0, MCH 29.9, RDW 16.1, Plt 262;  Medical History: Past Medical History:  Diagnosis Date  . Anemia   . Anxiety   . Depression   . Diabetes mellitus without complication ([place])   . Hyperlipidemia   . Hypertension   . Neuropathy   . Prostate CA [facility])     Surgical History: Past Surgical History:  Procedure Laterality Date  . CATARACT EXTRACTION  2016  . CHOLECYSTECTOMY  1975  . PROSTATE SURGERY  2002    Family History: History reviewed. No pertinent family history.  Social History: Social History   Socioeconomic History  . Marital status: Married    Spouse name: Not on file  . Number of children: 3  . Years of education: 49  . Highest education level: Not on file  Social Needs  . Financial resource strain: Not on file  . Food insecurity - worry: Not on file  . Food insecurity - inability: Not on file  . Transportation needs - medical: Not on file  . Transportation needs - non-medical: Not on file  Occupational History  . Occupation: Retired  Tobacco Use  . Smoking status: Former Smoker    Last attempt to quit: 1988    Years since quitting: 31.0  . Smokeless tobacco: Never Used  Substance and Sexual Activity  . Alcohol use: No    Alcohol/week: 0.0 oz  . Drug use: No  . Sexual activity: Not on file  Other Topics Concern  . Not on file  Social History Narrative   Lives at home w/ his wife   Right-handed   Caffeine: occasional decaf coffee    Allergies: No Known  Allergies  Medications:  Current Outpatient Medications  Medication Sig Dispense Refill  . amLODipine-atorvastatin (CADUET) 5-20 MG per tablet Take 1 tablet by mouth daily.    Marland Kitchen aspirin 81 MG tablet Take 81 mg by mouth daily.    Marland Kitchen buPROPion (WELLBUTRIN XL) 300 MG 24 hr tablet Take 300 mg by mouth daily.    . calcium carbonate (OS-CAL) 600 MG TABS tablet Take 600 mg by mouth 2 (two) times daily with a meal.    . Cholecalciferol (D3 ADULT PO) Take by mouth.    . ferrous sulfate 325 (65 FE) MG tablet Take 325 mg by mouth daily with breakfast.    . glimepiride (AMARYL) 4 MG tablet Take 4 mg by mouth daily with breakfast.    . metFORMIN (GLUCOPHAGE) 1000 MG tablet Take 1,000 mg by mouth 2 (two) times daily with a meal.    . Omega-3 Fatty Acids (OMEGA-3 FISH OIL PO) Take by mouth.    . sitaGLIPtin (JANUVIA) 50 MG tablet Take 50 mg by mouth daily.    Marland Kitchen triamcinolone (KENALOG) 0.025 % cream Apply 1 application topically 2 (two) times daily. 30 g 3  . valsartan-hydrochlorothiazide (DIOVAN-HCT) 320-12.5 MG per tablet Take 1 tablet by mouth daily.     No current facility-administered medications for this Richards.     Review of Systems: Review of Systems  Neurological: Positive for numbness. Negative for extremity weakness.  All other systems reviewed and are negative.    PHYSICAL EXAMINATION Blood pressure (!) 153/66,  pulse (!) 123, temperature 98 F (36.7 C), temperature source Oral, resp. rate 17, height '5\' 11"'$  (1.803 m), weight 144 lb 6.4 oz (65.5 kg), SpO2 97 %.  ECOG PERFORMANCE STATUS: 1 - Symptomatic but completely ambulatory  Physical Exam  Constitutional: He is oriented to person, place, and time and well-developed, well-nourished, and in no distress. No distress.  HENT:  Head: Normocephalic and atraumatic.  Mouth/Throat: Oropharynx is clear and moist. No oropharyngeal exudate.  Eyes: Conjunctivae are normal. No scleral icterus.  Neck: No thyromegaly present.  Cardiovascular:  Normal rate, normal heart sounds and intact distal pulses.  No murmur heard. Pulmonary/Chest: Effort normal and breath sounds normal. No respiratory distress. He has no wheezes. He has no rales. He exhibits no tenderness.  Abdominal: Soft. Bowel sounds are normal. He exhibits no distension. There is no tenderness. There is no rebound.  Musculoskeletal: He exhibits no edema.  Lymphadenopathy:    He has no cervical adenopathy.  Neurological: He is alert and oriented to person, place, and time. He has normal reflexes. No cranial nerve deficit.  Skin: Skin is warm and dry. No rash noted. He is not diaphoretic. No erythema. No pallor.     LABORATORY DATA: I have personally reviewed the data as listed: Appointment on 02/13/2017  Component Date Value Ref Range Status  . WBC 02/13/2017 3.0* 4.0 - 10.3 10e3/uL Final  . NEUT# 02/13/2017 1.5  1.5 - 6.5 10e3/uL Final  . HGB 02/13/2017 9.3* 13.0 - 17.1 g/dL Final  . HCT 02/13/2017 30.1* 38.4 - 49.9 % Final  . Platelets 02/13/2017 229  140 - 400 10e3/uL Final  . MCV 02/13/2017 94.1  79.3 - 98.0 fL Final  . MCH 02/13/2017 29.1  27.2 - 33.4 pg Final  . MCHC 02/13/2017 30.9* 32.0 - 36.0 g/dL Final  . RBC 02/13/2017 3.20* 4.20 - 5.82 10e6/uL Final  . RDW 02/13/2017 16.7* 11.0 - 14.6 % Final  . lymph# 02/13/2017 0.7* 0.9 - 3.3 10e3/uL Final  . MONO# 02/13/2017 0.8  0.1 - 0.9 10e3/uL Final  . Eosinophils Absolute 02/13/2017 0.0  0.0 - 0.5 10e3/uL Final  . Basophils Absolute 02/13/2017 0.0  0.0 - 0.1 10e3/uL Final  . NEUT% 02/13/2017 51.7  39.0 - 75.0 % Final  . LYMPH% 02/13/2017 22.0  14.0 - 49.0 % Final  . MONO% 02/13/2017 26.0* 0.0 - 14.0 % Final  . EOS% 02/13/2017 0.0  0.0 - 7.0 % Final  . BASO% 02/13/2017 0.3  0.0 - 2.0 % Final  . Retic % 02/13/2017 2.00* 0.80 - 1.80 % Final  . Retic Ct Abs 02/13/2017 64.00  34.80 - 93.90 10e3/uL Final  . Immature Retic Fract 02/13/2017 19.80* 3.00 - 10.60 % Final  . Polychromasia 02/13/2017 Slight  Slight Final   . Ovalocytes 02/13/2017 Few  Negative Final  . White Cell Comments 02/13/2017 Variant Lymphs, Rare myelocyte   Final  . PLT EST 02/13/2017 Adequate  Adequate Final  . Sodium 02/13/2017 139  136 - 145 mEq/L Final  . Potassium 02/13/2017 4.6  3.5 - 5.1 mEq/L Final  . Chloride 02/13/2017 107  98 - 109 mEq/L Final  . CO2 02/13/2017 19* 22 - 29 mEq/L Final  . Glucose 02/13/2017 64* 70 - 140 mg/dl Final   Glucose reference range is for nonfasting patients. Fasting glucose reference range is 70- 100.  Marland Kitchen BUN 02/13/2017 25.1  7.0 - 26.0 mg/dL Final  . Creatinine 02/13/2017 1.5* 0.7 - 1.3 mg/dL Final  . Total Bilirubin 02/13/2017 0.46  0.20 - 1.20 mg/dL Final  . Alkaline Phosphatase 02/13/2017 71  40 - 150 U/L Final  . AST 02/13/2017 29  5 - 34 U/L Final  . ALT 02/13/2017 24  0 - 55 U/L Final  . Total Protein 02/13/2017 7.6  6.4 - 8.3 g/dL Final  . Albumin 02/13/2017 3.7  3.5 - 5.0 g/dL Final  . Calcium 02/13/2017 9.7  8.4 - 10.4 mg/dL Final  . Anion Gap 02/13/2017 13* 3 - 11 mEq/L Final  . EGFR 02/13/2017 43* >60 ml/min/1.73 m2 Final   eGFR is calculated using the CKD-EPI Creatinine Equation (2009)  . LDH 02/13/2017 216  125 - 245 U/L Final  . Uric Acid, Serum 02/13/2017 8.5* 2.6 - 7.4 mg/dl Final  . Vitamin B12 02/13/2017 >2000* 232 - 1245 pg/mL Final  . Folate 02/13/2017 13.1  >3.0 ng/mL Final   Comment: A serum folate concentration of less than 3.1 ng/mL is considered to represent clinical deficiency.   . Ferritin 02/13/2017 140  22 - 316 ng/ml Final  . Iron 02/13/2017 40* 42 - 163 ug/dL Final  . TIBC 02/13/2017 318  202 - 409 ug/dL Final  . UIBC 02/13/2017 277  117 - 376 ug/dL Final  . %SAT 02/13/2017 13* 20 - 55 % Final  . Erythropoietin 02/13/2017 22.8* 2.6 - 18.5 mIU/mL Final   Beckman Coulter UniCel DxI 800 Immunoassay System  . Testosterone, Serum 02/13/2017 31* 264 - 916 ng/dL Final   Comment: Adult male reference interval is based on a population of healthy nonobese males  (BMI <30) between 15 and 20 years old. [place], [place] [phone]. PMID: [id number].   . Free Testosterone(Direct) 02/13/2017 1.2* 6.6 - 18.1 pg/mL Final  . Sex Horm Binding Glob, Serum 02/13/2017 157.6* 19.3 - 76.4 nmol/L Final  . Vitamin B6 02/13/2017 3.9* 5.3 - 46.7 ug/L Final   Comment: This test was developed and its performance characteristics determined by [facility]. It has not been cleared or approved by the Food and Drug Administration.   . Total Protein 02/13/2017 7.2  6.0 - 8.5 g/dL Final  . Albumin 02/13/2017 3.4  2.9 - 4.4 g/dL Final  . Alpha 1 02/13/2017 0.3  0.0 - 0.4 g/dL Final  . Alpha 2 02/13/2017 1.2* 0.4 - 1.0 g/dL Final  . Beta 02/13/2017 1.2  0.7 - 1.3 g/dL Final  . Gamma Globulin 02/13/2017 1.2  0.4 - 1.8 g/dL Final  . M-Spike, % 02/13/2017 Not Observed  Not Observed g/dL Final  . GLOBULIN, TOTAL 02/13/2017 3.8  2.2 - 3.9 g/dL Final  . A/G Ratio 02/13/2017 0.9  0.7 - 1.7 Final  . Please Note: 02/13/2017 Comment   Final   Comment: Protein electrophoresis scan will follow via computer, mail, or courier delivery.   . Interpretation(See Below) 02/13/2017 Comment   Final   Comment: The SPE pattern is suggestive of a subacute inflammatory response. This condition represents an intermediate stage between two possible courses for acute inflammation: total convalescense with a return to normal, or the onset of a chronic inflammatory condition.   . Copper 02/13/2017 123  72 - 166 ug/dL Final                                                  Detection Limit = 5  . Ceruloplasmin 02/13/2017 30.1  16.0 - 31.0 mg/dL Final  .  Methylmalonic Acid, Serum 02/13/2017 172  0 - 378 nmol/L Final  . Disclaimer: 02/13/2017 Comment   Final   Comment: This test was developed and its performance characteristics determined by [facility]. It has not been cleared or approved by the Food and Drug Administration.   Marland Kitchen TSH 02/13/2017 2.172  0.320 - 4.118 m(IU)/L Final          Ardath Sax, MD

## 2017-03-25 ENCOUNTER — Encounter (HOSPITAL_COMMUNITY): Payer: Self-pay

## 2017-03-26 LAB — TISSUE HYBRIDIZATION (BONE MARROW)-NCBH

## 2017-03-26 LAB — CHROMOSOME ANALYSIS, BONE MARROW

## 2017-03-27 ENCOUNTER — Other Ambulatory Visit: Payer: Self-pay | Admitting: Hematology and Oncology

## 2017-03-27 DIAGNOSIS — D469 Myelodysplastic syndrome, unspecified: Secondary | ICD-10-CM

## 2017-03-27 NOTE — Assessment & Plan Note (Signed)
84 y.o. male with diagnosis of Sjogren's syndrome referred to the clinic for bicytopenia with leukopenia and anemia.  Review of the hematological trends demonstrates progressive neutropenia and lymphopenia with associated normocytic normochromic anemia with trends towards macrocytosis.  Differential diagnosis is broad and includes development of myelodysplasia, acute myeloid leukemia, or development of systemic autoimmune condition such as systemic lupus.   Patient underwent bone marrow biopsy which demonstrates abnormal bone marrow with hypercellular tissue with increased granulocytic precursors without evidence of increased blasts.  At this time, this rules out acute myelogenous leukemia.  Nevertheless, the abnormalities, could be attributed to myelodysplastic syndrome or known autoimmune condition of Sjogren's syndrome.  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  Such translocations are frequently associated with myeloproliferative or myelodysplastic processes supporting the diagnosis of myelodysplastic syndrome.  At this time, white blood cell count has improved slightly with lymphocyte count returning back to normal and patient having mild neutropenia.  Hemoglobin remains somewhat stable, but with downward trend.  Plan: -- Observation with supportive care. -- Return to our clinic in 1 month with repeat lab work for hematological monitoring.Marland Kitchen

## 2017-03-27 NOTE — Progress Notes (Signed)
[facility] Follow-up Visit:  Assessment: Anemia 84 y.o. male with diagnosis of Sjogren's syndrome referred to the clinic for bicytopenia with leukopenia and anemia.  Review of the hematological trends demonstrates progressive neutropenia and lymphopenia with associated normocytic normochromic anemia with trends towards macrocytosis.  Differential diagnosis is broad and includes development of myelodysplasia, acute myeloid leukemia, or development of systemic autoimmune condition such as systemic lupus.   Patient underwent bone marrow biopsy which demonstrates abnormal bone marrow with hypercellular tissue with increased granulocytic precursors without evidence of increased blasts.  At this time, this rules out acute myelogenous leukemia.  Nevertheless, the abnormalities, could be attributed to myelodysplastic syndrome or known autoimmune condition of Sjogren's syndrome.  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  Such translocations are frequently associated with myeloproliferative or myelodysplastic processes supporting the diagnosis of myelodysplastic syndrome.  At this time, white blood cell count has improved slightly with lymphocyte count returning back to normal and patient having mild neutropenia.  Hemoglobin remains somewhat stable, but with downward trend.  Plan: -- Observation with supportive care. -- Return to our clinic in 1 month with repeat lab work for hematological monitoring..  Voice recognition software was used and creation of this note. Despite my best effort at editing the text, some misspelling/errors may have occurred.  Orders Placed This Encounter  Procedures  . CBC & Diff and Retic    Standing Status:   Future   Standing Expiration Date:   03/15/2018  . Comprehensive metabolic panel    Standing Status:   Future    Standing Expiration Date:   03/15/2018  . Lactate dehydrogenase (LDH)    Standing Status:   Future    Standing Expiration Date:   03/15/2018   All questions were answered.  . The patient knows to call the clinic with any problems, questions or concerns.  This note was electronically signed.    History of Presenting Illness Clayton Richards is an 84 y.o. male followed in the [facility] for anemia and lymphopenia, referred by Dr Anastasia Pall.  Patient's past medical history is significant for history of prostate cancer at the age of 14 with 5 weeks of radiation and Lupron.  Over the past 3 years, patient has been having peripheral neuropathy with gradual progression. He also has been diagnosed with Sjogren's syndrome and is being followed by rheumatology.  Patient is a lifelong non-smoker and does not drink alcohol.  At this time, patient denies fevers, chills, night sweats.  No recurrent infections.  Denies unexpected weight loss.  Continues to have peripheral neuropathy especially in the lower extremities.  No loss of strength or significant imbalance at this time.  Patient denies chest pain, shortness of breath, or cough.  No nausea, vomiting, abdominal pain, early satiety, diarrhea, or constipation.  No dysuria or hematuria.  Patient returns to the clinic after undergoing bone marrow biopsy for assessment.  He denies any new symptoms since the last visit other than continued fatigue and dry mouth causing some swallowing difficulties that he attributes to his known Sjogren's disease.  Oncological/hematological History: --Labs, 02/18/12: WBC 4.6, ANC 2.9, ALC 1.1, Mono 0.5, Hgb 10.8, MCV 88.0, MCH 28.1, RDW 15.1, Plt 339 --Labs, 08/20/12: WBC 5.0, ANC 3.3, ALC 1.2, Mono 0.4, Hgb 11.3, MCV 86.0, MCH 28.4,  RDW 15.1, Plt 337  --Labs, 04/16/16: WBC 2.8, ANC 1.7, ALC 0.8, Mono 0.4, Hgb 10.4, MCV  87.0, MCH 27.7, RDW 16.3, Plt 282 --Labs, 12/27/16: WBC 2.6, ANC 1.6, ALC 0.4, Mono 0.4, Hgb   9.8, MCV 93.0, MCH 29.9, RDW 16.1, Plt 262; --Labs, 03/07/17: WBC 3.3, ANC 1.5, ALC 0.8, Mono 1.0, Hgb   9.1, MCV 93.5, MCH 29.7, RDW 16.9, Plt 247; --BM Bx, 03/07/17: Hypercellular bone marrow with increased granulocytic precursors without increase in blasts.  Differential includes nutritional deficiency, immunological injury of the bone marrow, or possible myelodysplastic syndrome; CytoGen -- 3 cell lines identified, #1 -- ~10%, 46,XY, #2 -- 45,X- (loss of Y chromosome), #3 -- 46,XY,der(15)t(1;15)(q12p11.2); FISH -- normal.   Medical History: Past Medical History:  Diagnosis Date  . Anemia   . Anxiety   . Depression   . Diabetes mellitus without complication ([place])   . Hyperlipidemia   . Hypertension   . Neuropathy   . Prostate CA [facility])     Surgical History: Past Surgical History:  Procedure Laterality Date  . CATARACT EXTRACTION  2016  . CHOLECYSTECTOMY  1975  . PROSTATE SURGERY  2002    Family History: History reviewed. No pertinent family history.  Social History: Social History   Socioeconomic History  . Marital status: Married    Spouse name: Not on file  . Number of children: 3  . Years of education: 27  . Highest education level: Not on file  Social Needs  . Financial resource strain: Not on file  . Food insecurity - worry: Not on file  . Food insecurity - inability: Not on file  . Transportation needs - medical: Not on file  . Transportation needs - non-medical: Not on file  Occupational History  . Occupation: Retired  Tobacco Use  . Smoking status: Former Smoker    Last attempt to quit: 1988    Years since quitting: 31.0  . Smokeless tobacco: Never Used  Substance and Sexual Activity  . Alcohol use: No    Alcohol/week: 0.0 oz  . Drug use: No  . Sexual activity: Not on file  Other Topics Concern  . Not on file  Social History Narrative   Lives at home w/  his wife   Right-handed   Caffeine: occasional decaf coffee    Allergies: No Known Allergies  Medications:  Current Outpatient Medications  Medication Sig Dispense Refill  . amLODipine-atorvastatin (CADUET) 5-20 MG per tablet Take 1 tablet by mouth daily.    Marland Kitchen aspirin 81 MG tablet Take 81 mg by mouth daily.    Marland Kitchen buPROPion (WELLBUTRIN XL) 300 MG 24 hr tablet Take 300 mg by mouth daily.    . calcium carbonate (OS-CAL) 600 MG TABS tablet Take 600 mg by mouth 2 (two) times daily with a meal.    . Cholecalciferol (D3 ADULT PO) Take by mouth.    . ferrous sulfate 325 (65 FE) MG tablet Take 325 mg by mouth daily with breakfast.    . glimepiride (AMARYL) 4 MG tablet Take 4 mg by mouth daily with breakfast.    . metFORMIN (GLUCOPHAGE) 1000 MG tablet Take 1,000 mg by mouth 2 (two) times daily with a meal.    . Omega-3 Fatty Acids (OMEGA-3 FISH OIL PO) Take by mouth.    . sitaGLIPtin (JANUVIA) 50 MG tablet Take 50 mg by mouth daily.    Marland Kitchen triamcinolone (KENALOG) 0.025 % cream Apply 1 application topically 2 (two) times daily. 30 g 3  .  valsartan-hydrochlorothiazide (DIOVAN-HCT) 320-12.5 MG per tablet Take 1 tablet by mouth daily.     No current facility-administered medications for this visit.     Review of Systems: Review of Systems  Constitutional: Positive for fatigue.  Neurological: Positive for numbness. Negative for extremity weakness.  All other systems reviewed and are negative.    PHYSICAL EXAMINATION Blood pressure (!) 145/54, pulse (!) 117, temperature 97.8 F (36.6 C), temperature source Oral, resp. rate 19, height '5\' 10"'$  (1.778 m), weight 145 lb 3.2 oz (65.9 kg), SpO2 99 %.  ECOG PERFORMANCE STATUS: 1 - Symptomatic but completely ambulatory  Physical Exam  Constitutional: He is oriented to person, place, and time and well-developed, well-nourished, and in no distress. No distress.  HENT:  Head: Normocephalic and atraumatic.  Mouth/Throat: Oropharynx is clear and moist.  No oropharyngeal exudate.  Eyes: Conjunctivae are normal. No scleral icterus.  Neck: No thyromegaly present.  Cardiovascular: Normal rate, normal heart sounds and intact distal pulses.  No murmur heard. Pulmonary/Chest: Effort normal and breath sounds normal. No respiratory distress. He has no wheezes. He has no rales. He exhibits no tenderness.  Abdominal: Soft. Bowel sounds are normal. He exhibits no distension. There is no tenderness. There is no rebound.  Musculoskeletal: He exhibits no edema.  Lymphadenopathy:    He has no cervical adenopathy.  Neurological: He is alert and oriented to person, place, and time. He has normal reflexes. No cranial nerve deficit.  Skin: Skin is warm and dry. No rash noted. He is not diaphoretic. No erythema. No pallor.     LABORATORY DATA: I have personally reviewed the data as listed: No visits with results within 1 Week(s) from this visit.  Latest known visit with results is:  Hospital Outpatient Visit on 03/07/2017  Component Date Value Ref Range Status  . Glucose-Capillary 03/07/2017 127* 65 - 99 mg/dL Final  . Comment 1 03/07/2017 Notify RN   Final  . Comment 2 03/07/2017 Document in Chart   Final       Ardath Sax, MD

## 2017-04-15 ENCOUNTER — Other Ambulatory Visit: Payer: Self-pay

## 2017-04-15 ENCOUNTER — Inpatient Hospital Stay: Payer: Medicare Other | Attending: Hematology and Oncology | Admitting: Hematology and Oncology

## 2017-04-15 ENCOUNTER — Inpatient Hospital Stay: Payer: Medicare Other

## 2017-04-15 ENCOUNTER — Encounter: Payer: Self-pay | Admitting: Hematology and Oncology

## 2017-04-15 VITALS — BP 136/59 | HR 78 | Temp 98.6°F | Resp 17 | Ht 70.0 in | Wt 137.4 lb

## 2017-04-15 DIAGNOSIS — D696 Thrombocytopenia, unspecified: Secondary | ICD-10-CM | POA: Insufficient documentation

## 2017-04-15 DIAGNOSIS — D469 Myelodysplastic syndrome, unspecified: Secondary | ICD-10-CM

## 2017-04-15 DIAGNOSIS — I10 Essential (primary) hypertension: Secondary | ICD-10-CM | POA: Insufficient documentation

## 2017-04-15 DIAGNOSIS — D72810 Lymphocytopenia: Secondary | ICD-10-CM | POA: Diagnosis not present

## 2017-04-15 DIAGNOSIS — D649 Anemia, unspecified: Secondary | ICD-10-CM | POA: Diagnosis present

## 2017-04-15 DIAGNOSIS — M3500 Sicca syndrome, unspecified: Secondary | ICD-10-CM | POA: Insufficient documentation

## 2017-04-15 DIAGNOSIS — E119 Type 2 diabetes mellitus without complications: Secondary | ICD-10-CM | POA: Insufficient documentation

## 2017-04-15 LAB — RETICULOCYTES
RBC.: 2.75 MIL/uL — ABNORMAL LOW (ref 4.20–5.82)
Retic Count, Absolute: 55 10*3/uL (ref 34.8–93.9)
Retic Ct Pct: 2 % — ABNORMAL HIGH (ref 0.8–1.8)

## 2017-04-15 LAB — COMPREHENSIVE METABOLIC PANEL
ALBUMIN: 3.3 g/dL — AB (ref 3.5–5.0)
ALT: 16 U/L (ref 0–55)
ANION GAP: 10 (ref 3–11)
AST: 20 U/L (ref 5–34)
Alkaline Phosphatase: 74 U/L (ref 40–150)
BUN: 27 mg/dL — ABNORMAL HIGH (ref 7–26)
CO2: 22 mmol/L (ref 22–29)
Calcium: 9.3 mg/dL (ref 8.4–10.4)
Chloride: 107 mmol/L (ref 98–109)
Creatinine, Ser: 1.28 mg/dL (ref 0.70–1.30)
GFR calc Af Amer: 56 mL/min — ABNORMAL LOW (ref >60)
GFR calc non Af Amer: 49 mL/min — ABNORMAL LOW (ref >60)
GLUCOSE: 60 mg/dL — AB (ref 70–140)
POTASSIUM: 4.6 mmol/L (ref 3.5–5.1)
SODIUM: 139 mmol/L (ref 136–145)
Total Bilirubin: 0.3 mg/dL (ref 0.2–1.2)
Total Protein: 7.1 g/dL (ref 6.4–8.3)

## 2017-04-15 LAB — CBC WITH DIFFERENTIAL/PLATELET
BASOS PCT: 0 %
Basophils Absolute: 0 10*3/uL (ref 0.0–0.1)
Eosinophils Absolute: 0 10*3/uL (ref 0.0–0.5)
Eosinophils Relative: 1 %
HEMATOCRIT: 25.2 % — AB (ref 38.4–49.9)
HEMOGLOBIN: 8.1 g/dL — AB (ref 13.0–17.1)
LYMPHS ABS: 0.7 10*3/uL — AB (ref 0.9–3.3)
Lymphocytes Relative: 26 %
MCH: 29.9 pg (ref 27.2–33.4)
MCHC: 32.3 g/dL (ref 32.0–36.0)
MCV: 92.5 fL (ref 79.3–98.0)
MONOS PCT: 35 %
Monocytes Absolute: 0.9 10*3/uL (ref 0.1–0.9)
NEUTROS ABS: 1 10*3/uL — AB (ref 1.5–6.5)
NEUTROS PCT: 38 %
Platelets: 214 10*3/uL (ref 140–400)
RBC: 2.72 MIL/uL — ABNORMAL LOW (ref 4.20–5.82)
RDW: 17.7 % — ABNORMAL HIGH (ref 11.0–14.6)
WBC: 2.6 10*3/uL — ABNORMAL LOW (ref 4.0–10.3)

## 2017-04-15 LAB — LACTATE DEHYDROGENASE: LDH: 189 U/L (ref 125–245)

## 2017-04-16 LAB — ERYTHROPOIETIN: ERYTHROPOIETIN: 48.5 m[IU]/mL — AB (ref 2.6–18.5)

## 2017-04-16 LAB — ANCA TITERS

## 2017-04-16 LAB — CENTROMERE ANTIBODIES: Centromere Ab Screen: <0.2 AI (ref 0.0–0.9)

## 2017-04-16 LAB — ANTI-DNA ANTIBODY, DOUBLE-STRANDED: ds DNA Ab: 1 IU/mL (ref 0–9)

## 2017-04-19 LAB — VITAMIN B6: VITAMIN B6: 2.8 ug/L — AB (ref 5.3–46.7)

## 2017-04-24 ENCOUNTER — Telehealth: Payer: Self-pay | Admitting: Hematology and Oncology

## 2017-04-24 ENCOUNTER — Inpatient Hospital Stay: Payer: Medicare Other

## 2017-04-24 ENCOUNTER — Encounter: Payer: Self-pay | Admitting: Hematology and Oncology

## 2017-04-24 ENCOUNTER — Inpatient Hospital Stay: Payer: Medicare Other | Admitting: Hematology and Oncology

## 2017-04-24 VITALS — BP 127/56 | HR 100 | Temp 98.5°F | Resp 17 | Ht 70.0 in | Wt 139.4 lb

## 2017-04-24 DIAGNOSIS — M2500 Hemarthrosis, unspecified joint: Secondary | ICD-10-CM | POA: Diagnosis not present

## 2017-04-24 DIAGNOSIS — D72810 Lymphocytopenia: Secondary | ICD-10-CM | POA: Diagnosis not present

## 2017-04-24 DIAGNOSIS — D696 Thrombocytopenia, unspecified: Secondary | ICD-10-CM | POA: Diagnosis not present

## 2017-04-24 DIAGNOSIS — D469 Myelodysplastic syndrome, unspecified: Secondary | ICD-10-CM | POA: Insufficient documentation

## 2017-04-24 DIAGNOSIS — I10 Essential (primary) hypertension: Secondary | ICD-10-CM | POA: Diagnosis not present

## 2017-04-24 DIAGNOSIS — D649 Anemia, unspecified: Secondary | ICD-10-CM

## 2017-04-24 DIAGNOSIS — E119 Type 2 diabetes mellitus without complications: Secondary | ICD-10-CM | POA: Diagnosis not present

## 2017-04-24 LAB — CBC WITH DIFFERENTIAL (CANCER CENTER ONLY)
Basophils Absolute: 0 10*3/uL (ref 0.0–0.1)
Basophils Relative: 0 %
Eosinophils Absolute: 0 10*3/uL (ref 0.0–0.5)
Eosinophils Relative: 0 %
HEMATOCRIT: 25.2 % — AB (ref 38.4–49.9)
Hemoglobin: 8.1 g/dL — ABNORMAL LOW (ref 13.0–17.1)
LYMPHS PCT: 20 %
Lymphs Abs: 0.7 10*3/uL — ABNORMAL LOW (ref 0.9–3.3)
MCH: 29.9 pg (ref 27.2–33.4)
MCHC: 32.2 g/dL (ref 32.0–36.0)
MCV: 93 fL (ref 79.3–98.0)
MONOS PCT: 32 %
Monocytes Absolute: 1.1 10*3/uL — ABNORMAL HIGH (ref 0.1–0.9)
NEUTROS ABS: 1.6 10*3/uL (ref 1.5–6.5)
Neutrophils Relative %: 48 %
Platelet Count: 226 10*3/uL (ref 140–400)
RBC: 2.71 MIL/uL — AB (ref 4.20–5.82)
RDW: 17.8 % — ABNORMAL HIGH (ref 11.0–14.6)
WBC: 3.4 10*3/uL — AB (ref 4.0–10.3)

## 2017-04-24 LAB — SAMPLE TO BLOOD BANK

## 2017-04-24 NOTE — Telephone Encounter (Signed)
Scheduled appt per 2/13 los - Gave patient AVS and calender per los.  

## 2017-04-28 ENCOUNTER — Emergency Department (HOSPITAL_COMMUNITY): Payer: Medicare Other

## 2017-04-28 ENCOUNTER — Inpatient Hospital Stay (HOSPITAL_COMMUNITY)
Admission: EM | Admit: 2017-04-28 | Discharge: 2017-05-01 | DRG: 313 | Disposition: A | Discharge status: Home Health | Payer: Medicare Other | Attending: Family Medicine | Admitting: Family Medicine

## 2017-04-28 ENCOUNTER — Encounter (HOSPITAL_COMMUNITY): Payer: Self-pay

## 2017-04-28 ENCOUNTER — Other Ambulatory Visit: Payer: Self-pay

## 2017-04-28 DIAGNOSIS — I4891 Unspecified atrial fibrillation: Secondary | ICD-10-CM | POA: Diagnosis not present

## 2017-04-28 DIAGNOSIS — E1122 Type 2 diabetes mellitus with diabetic chronic kidney disease: Secondary | ICD-10-CM | POA: Diagnosis present

## 2017-04-28 DIAGNOSIS — E1140 Type 2 diabetes mellitus with diabetic neuropathy, unspecified: Secondary | ICD-10-CM | POA: Diagnosis present

## 2017-04-28 DIAGNOSIS — E46 Unspecified protein-calorie malnutrition: Secondary | ICD-10-CM | POA: Diagnosis present

## 2017-04-28 DIAGNOSIS — Z66 Do not resuscitate: Secondary | ICD-10-CM | POA: Diagnosis present

## 2017-04-28 DIAGNOSIS — I248 Other forms of acute ischemic heart disease: Secondary | ICD-10-CM | POA: Diagnosis present

## 2017-04-28 DIAGNOSIS — K922 Gastrointestinal hemorrhage, unspecified: Secondary | ICD-10-CM | POA: Diagnosis present

## 2017-04-28 DIAGNOSIS — T508X5A Adverse effect of diagnostic agents, initial encounter: Secondary | ICD-10-CM | POA: Diagnosis present

## 2017-04-28 DIAGNOSIS — D638 Anemia in other chronic diseases classified elsewhere: Secondary | ICD-10-CM | POA: Diagnosis present

## 2017-04-28 DIAGNOSIS — R079 Chest pain, unspecified: Secondary | ICD-10-CM | POA: Diagnosis not present

## 2017-04-28 DIAGNOSIS — R778 Other specified abnormalities of plasma proteins: Secondary | ICD-10-CM

## 2017-04-28 DIAGNOSIS — Z7982 Long term (current) use of aspirin: Secondary | ICD-10-CM

## 2017-04-28 DIAGNOSIS — I130 Hypertensive heart and chronic kidney disease with heart failure and stage 1 through stage 4 chronic kidney disease, or unspecified chronic kidney disease: Secondary | ICD-10-CM | POA: Diagnosis present

## 2017-04-28 DIAGNOSIS — R0789 Other chest pain: Secondary | ICD-10-CM | POA: Diagnosis present

## 2017-04-28 DIAGNOSIS — N183 Chronic kidney disease, stage 3 (moderate): Secondary | ICD-10-CM | POA: Diagnosis present

## 2017-04-28 DIAGNOSIS — Z87891 Personal history of nicotine dependence: Secondary | ICD-10-CM

## 2017-04-28 DIAGNOSIS — I509 Heart failure, unspecified: Secondary | ICD-10-CM

## 2017-04-28 DIAGNOSIS — I361 Nonrheumatic tricuspid (valve) insufficiency: Secondary | ICD-10-CM | POA: Diagnosis not present

## 2017-04-28 DIAGNOSIS — I480 Paroxysmal atrial fibrillation: Secondary | ICD-10-CM | POA: Diagnosis present

## 2017-04-28 DIAGNOSIS — I319 Disease of pericardium, unspecified: Secondary | ICD-10-CM | POA: Diagnosis present

## 2017-04-28 DIAGNOSIS — R9431 Abnormal electrocardiogram [ECG] [EKG]: Secondary | ICD-10-CM | POA: Diagnosis not present

## 2017-04-28 DIAGNOSIS — M3500 Sicca syndrome, unspecified: Secondary | ICD-10-CM | POA: Diagnosis present

## 2017-04-28 DIAGNOSIS — Z79899 Other long term (current) drug therapy: Secondary | ICD-10-CM

## 2017-04-28 DIAGNOSIS — I2510 Atherosclerotic heart disease of native coronary artery without angina pectoris: Secondary | ICD-10-CM | POA: Diagnosis present

## 2017-04-28 DIAGNOSIS — D469 Myelodysplastic syndrome, unspecified: Secondary | ICD-10-CM | POA: Diagnosis present

## 2017-04-28 DIAGNOSIS — N179 Acute kidney failure, unspecified: Secondary | ICD-10-CM | POA: Diagnosis present

## 2017-04-28 DIAGNOSIS — Z8546 Personal history of malignant neoplasm of prostate: Secondary | ICD-10-CM

## 2017-04-28 DIAGNOSIS — E785 Hyperlipidemia, unspecified: Secondary | ICD-10-CM | POA: Diagnosis present

## 2017-04-28 DIAGNOSIS — R7989 Other specified abnormal findings of blood chemistry: Secondary | ICD-10-CM

## 2017-04-28 DIAGNOSIS — I481 Persistent atrial fibrillation: Secondary | ICD-10-CM | POA: Diagnosis not present

## 2017-04-28 DIAGNOSIS — I5020 Unspecified systolic (congestive) heart failure: Secondary | ICD-10-CM | POA: Diagnosis present

## 2017-04-28 DIAGNOSIS — R0600 Dyspnea, unspecified: Secondary | ICD-10-CM | POA: Diagnosis present

## 2017-04-28 DIAGNOSIS — F419 Anxiety disorder, unspecified: Secondary | ICD-10-CM | POA: Diagnosis present

## 2017-04-28 DIAGNOSIS — R911 Solitary pulmonary nodule: Secondary | ICD-10-CM | POA: Diagnosis present

## 2017-04-28 DIAGNOSIS — Z681 Body mass index (BMI) 19 or less, adult: Secondary | ICD-10-CM | POA: Diagnosis not present

## 2017-04-28 DIAGNOSIS — D649 Anemia, unspecified: Secondary | ICD-10-CM

## 2017-04-28 DIAGNOSIS — F329 Major depressive disorder, single episode, unspecified: Secondary | ICD-10-CM | POA: Diagnosis present

## 2017-04-28 DIAGNOSIS — I700 Atherosclerosis of aorta: Secondary | ICD-10-CM | POA: Diagnosis present

## 2017-04-28 DIAGNOSIS — I429 Cardiomyopathy, unspecified: Secondary | ICD-10-CM | POA: Diagnosis not present

## 2017-04-28 DIAGNOSIS — Z7984 Long term (current) use of oral hypoglycemic drugs: Secondary | ICD-10-CM

## 2017-04-28 DIAGNOSIS — R748 Abnormal levels of other serum enzymes: Secondary | ICD-10-CM | POA: Diagnosis present

## 2017-04-28 LAB — LIPID PANEL
CHOL/HDL RATIO: 3.6 ratio
Cholesterol: 91 mg/dL (ref 0–200)
HDL: 25 mg/dL — AB (ref >40)
LDL CALC: 48 mg/dL (ref 0–99)
TRIGLYCERIDES: 88 mg/dL (ref <150)
VLDL: 18 mg/dL (ref 0–40)

## 2017-04-28 LAB — HEMOGLOBIN AND HEMATOCRIT, BLOOD
HCT: 24.9 % — ABNORMAL LOW (ref 39.0–52.0)
HEMOGLOBIN: 7.8 g/dL — AB (ref 13.0–17.0)

## 2017-04-28 LAB — CBC
HEMATOCRIT: 25.8 % — AB (ref 39.0–52.0)
Hemoglobin: 8.1 g/dL — ABNORMAL LOW (ref 13.0–17.0)
MCH: 29.5 pg (ref 26.0–34.0)
MCHC: 31.4 g/dL (ref 30.0–36.0)
MCV: 93.8 fL (ref 78.0–100.0)
Platelets: 235 10*3/uL (ref 150–400)
RBC: 2.75 MIL/uL — ABNORMAL LOW (ref 4.22–5.81)
RDW: 17.2 % — AB (ref 11.5–15.5)
WBC: 9.1 10*3/uL (ref 4.0–10.5)

## 2017-04-28 LAB — COMPREHENSIVE METABOLIC PANEL
ALT: 13 U/L — ABNORMAL LOW (ref 17–63)
AST: 21 U/L (ref 15–41)
Albumin: 3 g/dL — ABNORMAL LOW (ref 3.5–5.0)
Alkaline Phosphatase: 61 U/L (ref 38–126)
Anion gap: 12 (ref 5–15)
BILIRUBIN TOTAL: 1 mg/dL (ref 0.3–1.2)
BUN: 19 mg/dL (ref 6–20)
CO2: 19 mmol/L — ABNORMAL LOW (ref 22–32)
Calcium: 8.8 mg/dL — ABNORMAL LOW (ref 8.9–10.3)
Chloride: 103 mmol/L (ref 101–111)
Creatinine, Ser: 1.37 mg/dL — ABNORMAL HIGH (ref 0.61–1.24)
GFR, EST AFRICAN AMERICAN: 52 mL/min — AB (ref >60)
GFR, EST NON AFRICAN AMERICAN: 45 mL/min — AB (ref >60)
Glucose, Bld: 197 mg/dL — ABNORMAL HIGH (ref 65–99)
Potassium: 4.3 mmol/L (ref 3.5–5.1)
Sodium: 134 mmol/L — ABNORMAL LOW (ref 135–145)
TOTAL PROTEIN: 6.8 g/dL (ref 6.5–8.1)

## 2017-04-28 LAB — TROPONIN I
TROPONIN I: 0.08 ng/mL — AB (ref <0.03)
TROPONIN I: 0.09 ng/mL — AB (ref <0.03)
Troponin I: 0.09 ng/mL (ref <0.03)

## 2017-04-28 LAB — TSH: TSH: 1.313 u[IU]/mL (ref 0.350–4.500)

## 2017-04-28 LAB — I-STAT CHEM 8, ED
BUN: 20 mg/dL (ref 6–20)
CALCIUM ION: 1.16 mmol/L (ref 1.15–1.40)
CHLORIDE: 101 mmol/L (ref 101–111)
Creatinine, Ser: 1.3 mg/dL — ABNORMAL HIGH (ref 0.61–1.24)
GLUCOSE: 192 mg/dL — AB (ref 65–99)
HCT: 25 % — ABNORMAL LOW (ref 39.0–52.0)
Hemoglobin: 8.5 g/dL — ABNORMAL LOW (ref 13.0–17.0)
Potassium: 4.2 mmol/L (ref 3.5–5.1)
SODIUM: 135 mmol/L (ref 135–145)
TCO2: 22 mmol/L (ref 22–32)

## 2017-04-28 LAB — I-STAT TROPONIN, ED: TROPONIN I, POC: 0.15 ng/mL — AB (ref 0.00–0.08)

## 2017-04-28 LAB — BRAIN NATRIURETIC PEPTIDE: B NATRIURETIC PEPTIDE 5: 509.7 pg/mL — AB (ref 0.0–100.0)

## 2017-04-28 LAB — HEMOGLOBIN A1C
Hgb A1c MFr Bld: 6.6 % — ABNORMAL HIGH (ref 4.8–5.6)
Mean Plasma Glucose: 142.72 mg/dL

## 2017-04-28 LAB — PREPARE RBC (CROSSMATCH)

## 2017-04-28 LAB — CBG MONITORING, ED: Glucose-Capillary: 182 mg/dL — ABNORMAL HIGH (ref 65–99)

## 2017-04-28 LAB — GLUCOSE, CAPILLARY: Glucose-Capillary: 276 mg/dL — ABNORMAL HIGH (ref 65–99)

## 2017-04-28 LAB — POC OCCULT BLOOD, ED: Fecal Occult Bld: POSITIVE — AB

## 2017-04-28 MED ORDER — FENTANYL CITRATE (PF) 100 MCG/2ML IJ SOLN
50.0000 ug | Freq: Once | INTRAMUSCULAR | Status: AC
  Administered 2017-04-28: 50 ug via INTRAVENOUS
  Filled 2017-04-28: qty 2

## 2017-04-28 MED ORDER — BUPROPION HCL ER (XL) 150 MG PO TB24
300.0000 mg | ORAL_TABLET | Freq: Every day | ORAL | Status: DC
  Administered 2017-04-28 – 2017-05-01 (×4): 300 mg via ORAL
  Filled 2017-04-28 (×4): qty 2

## 2017-04-28 MED ORDER — INSULIN ASPART 100 UNIT/ML ~~LOC~~ SOLN
0.0000 [IU] | Freq: Every day | SUBCUTANEOUS | Status: DC
  Administered 2017-04-28: 3 [IU] via SUBCUTANEOUS

## 2017-04-28 MED ORDER — ASPIRIN EC 81 MG PO TBEC
81.0000 mg | DELAYED_RELEASE_TABLET | Freq: Every day | ORAL | Status: DC
  Administered 2017-04-29: 81 mg via ORAL
  Filled 2017-04-28: qty 1

## 2017-04-28 MED ORDER — FERROUS SULFATE 325 (65 FE) MG PO TABS
325.0000 mg | ORAL_TABLET | Freq: Three times a day (TID) | ORAL | Status: DC
  Administered 2017-04-28 – 2017-05-01 (×10): 325 mg via ORAL
  Filled 2017-04-28 (×11): qty 1

## 2017-04-28 MED ORDER — INSULIN ASPART 100 UNIT/ML ~~LOC~~ SOLN
0.0000 [IU] | Freq: Three times a day (TID) | SUBCUTANEOUS | Status: DC
  Administered 2017-04-28 – 2017-04-29 (×2): 2 [IU] via SUBCUTANEOUS
  Administered 2017-04-29: 1 [IU] via SUBCUTANEOUS
  Administered 2017-04-29 – 2017-05-01 (×6): 2 [IU] via SUBCUTANEOUS
  Filled 2017-04-28: qty 1

## 2017-04-28 MED ORDER — ACETAMINOPHEN 325 MG PO TABS
650.0000 mg | ORAL_TABLET | Freq: Four times a day (QID) | ORAL | Status: DC | PRN
  Administered 2017-04-28 – 2017-04-29 (×2): 650 mg via ORAL
  Filled 2017-04-28 (×2): qty 2

## 2017-04-28 MED ORDER — SODIUM CHLORIDE 0.9 % IV SOLN
Freq: Once | INTRAVENOUS | Status: AC
Start: 2017-04-28 — End: 2017-04-28
  Administered 2017-04-28: via INTRAVENOUS

## 2017-04-28 MED ORDER — IOPAMIDOL (ISOVUE-370) INJECTION 76%
INTRAVENOUS | Status: AC
  Administered 2017-04-28: 100 mL
  Filled 2017-04-28: qty 100

## 2017-04-28 MED ORDER — CARVEDILOL 3.125 MG PO TABS
3.1250 mg | ORAL_TABLET | Freq: Two times a day (BID) | ORAL | Status: DC
  Administered 2017-04-28 – 2017-04-30 (×5): 3.125 mg via ORAL
  Filled 2017-04-28 (×5): qty 1

## 2017-04-28 MED ORDER — ONDANSETRON HCL 4 MG/2ML IJ SOLN: 4.0000 mg | Freq: Four times a day (QID) | INTRAMUSCULAR | Status: DC | PRN

## 2017-04-28 MED ORDER — FUROSEMIDE 10 MG/ML IJ SOLN
20.0000 mg | Freq: Once | INTRAMUSCULAR | Status: AC
  Administered 2017-04-28: 20 mg via INTRAVENOUS
  Filled 2017-04-28: qty 2

## 2017-04-28 MED ORDER — SODIUM CHLORIDE 0.9 % IV BOLUS (SEPSIS)
1000.0000 mL | Freq: Once | INTRAVENOUS | Status: AC
  Administered 2017-04-28: 1000 mL via INTRAVENOUS

## 2017-04-28 NOTE — Progress Notes (Signed)
Repeat Hgb was 7.8. Given that patient is here for ACS rule out and has known coronary atherosclerosis, will transfuse 1 unit. Patient updated on plan.  Hyman Bible, MD PGY-3

## 2017-04-28 NOTE — ED Notes (Signed)
Dinner tray provided

## 2017-04-28 NOTE — Progress Notes (Signed)
Pt transferred from ED with RN. Pt awake and oriented, denies chest pain. VSS at this time, will continue to monitor.

## 2017-04-28 NOTE — ED Provider Notes (Signed)
[facility] EMERGENCY DEPARTMENT Provider Note  CSN: [id number] Arrival date & time: 04/28/17 6283  Chief Complaint(s) Chest Pain; Back Pain; and Shortness of Breath  HPI Clayton Richards is a 84 y.o. male with a history of hypertension, hyperlipidemia, diabetes, prior prostate cancer in remission, MDS who presents with 2 days of pleuritic right-sided sharp chest pain that radiates to the back.  Pain is also exacerbated with physical movement.  It is not exertional.  Alleviated by being still and shallow breathing.  Denies any recent fevers, chills, nausea, vomiting, abdominal pain, lower extremity swelling.  He does endorse dyspnea on exertion which is been ongoing for several months due to his anemia.  In addition he endorses decreased appetite the past several weeks.  He denies any other physical complaints at this time.  Patient was a code STEMI in route due to concerning EKG changes.    HPI  Past Medical History Past Medical History:  Diagnosis Date  . Anemia   . Anxiety   . Depression   . Diabetes mellitus without complication ([place])   . Hyperlipidemia   . Hypertension   . Neuropathy   . Prostate CA [facility])    Patient Active Problem List   Diagnosis Date Noted  . Myelodysplastic syndrome ([place]) 04/24/2017  . Anemia 02/13/2017  . Lymphopenia 02/13/2017  . Polyneuropathy, peripheral sensorimotor axonal 08/08/2016  . Diabetes ([place]) 08/08/2016   Home Medication(s) Prior to Admission medications   Medication Sig Start Date End Date Taking? Authorizing Provider  amLODipine-atorvastatin (CADUET) 5-20 MG per tablet Take 1 tablet by mouth daily.   Yes [provider]  aspirin 81 MG tablet Take 81 mg by mouth daily.   Yes [provider]  buPROPion (WELLBUTRIN XL) 300 MG 24 hr tablet Take 300 mg by mouth daily.   Yes [provider]  calcium carbonate (OS-CAL) 600 MG TABS tablet Take 600 mg by mouth 2 (two) times daily with a meal.   Yes  [provider]  Cholecalciferol (VITAMIN D) 2000 units CAPS Take 2,000 Units by mouth daily.   Yes [provider]  Cyanocobalamin (VITAMIN B 12 PO) Take 5,000 mcg by mouth daily.   Yes [provider]  ferrous sulfate 325 (65 FE) MG tablet Take 325 mg by mouth 3 (three) times daily with meals.    Yes [provider]  glimepiride (AMARYL) 4 MG tablet Take 4 mg by mouth daily with breakfast.   Yes [provider]  metFORMIN (GLUCOPHAGE) 1000 MG tablet Take 1,000 mg by mouth 2 (two) times daily with a meal.   Yes [provider]  Omega-3 Fatty Acids (OMEGA-3 FISH OIL PO) Take by mouth.   Yes [provider]  Probiotic Product (ALIGN PO) Take 1 tablet by mouth daily.   Yes [provider]  sitaGLIPtin (JANUVIA) 50 MG tablet Take 50 mg by mouth daily.   Yes [provider]  Sodium Bicarbonate-Citric Acid (ALKA-SELTZER HEARTBURN PO) Take 1 tablet by mouth as needed (indigestion).   Yes [provider]  valsartan-hydrochlorothiazide (DIOVAN-HCT) 320-12.5 MG per tablet Take 1 tablet by mouth daily.   Yes [provider]  Past Surgical History Past Surgical History:  Procedure Laterality Date  . CATARACT EXTRACTION  2016  . CHOLECYSTECTOMY  1975  . PROSTATE SURGERY  2002   Family History No family history on file.  Social History Social History   Tobacco Use  . Smoking status: Former Smoker    Last attempt to quit: 1988    Years since quitting: 31.1  . Smokeless tobacco: Never Used  Substance Use Topics  . Alcohol use: No    Alcohol/week: 0.0 oz  . Drug use: No   Allergies Patient has no known allergies.  Review of Systems Review of Systems All other systems are reviewed and are negative for acute change except as noted in the HPI  Physical Exam Vital  Signs  I have reviewed the triage vital signs BP 133/73 (BP Location: Left Arm)   Pulse (!) 112   Temp 98.5 F (36.9 C) (Oral)   Resp (!) 27   Ht 5\' 10"  (1.778 m)   Wt 59.9 kg (132 lb)   SpO2 96%   BMI 18.94 kg/m   Physical Exam  Constitutional: He is oriented to person, place, and time. He appears well-developed and well-nourished. He has a sickly appearance. No distress.  HENT:  Head: Normocephalic and atraumatic.  Nose: Nose normal.  Eyes: Conjunctivae and EOM are normal. Pupils are equal, round, and reactive to light. Right eye exhibits no discharge. Left eye exhibits no discharge. No scleral icterus.  Neck: Normal range of motion. Neck supple.  Cardiovascular: Regular rhythm. Tachycardia present. Exam reveals no gallop and no friction rub.  No murmur heard. Pulmonary/Chest: Effort normal and breath sounds normal. No stridor. No respiratory distress. He has no rales.  Abdominal: Soft. He exhibits no distension. There is no tenderness.  Musculoskeletal: He exhibits no edema or tenderness.  No peripheral edema  Neurological: He is alert and oriented to person, place, and time.  Skin: Skin is warm and dry. No rash noted. He is not diaphoretic. No erythema.  Psychiatric: He has a normal mood and affect.  Vitals reviewed.   ED Results and Treatments Labs (all labs ordered are listed, but only abnormal results are displayed) Labs Reviewed  CBC - Abnormal; Notable for the following components:      Result Value   RBC 2.75 (*)    Hemoglobin 8.1 (*)    HCT 25.8 (*)    RDW 17.2 (*)    All other components within normal limits  COMPREHENSIVE METABOLIC PANEL - Abnormal; Notable for the following components:   Sodium 134 (*)    CO2 19 (*)    Glucose, Bld 197 (*)    Creatinine, Ser 1.37 (*)    Calcium 8.8 (*)    Albumin 3.0 (*)    ALT 13 (*)    GFR calc non Af Amer 45 (*)    GFR calc Af Amer 52 (*)    All other components within normal limits  BRAIN NATRIURETIC PEPTIDE -  Abnormal; Notable for the following components:   B Natriuretic Peptide 509.7 (*)    All other components within normal limits  I-STAT TROPONIN, ED - Abnormal; Notable for the following components:   Troponin i, poc 0.15 (*)    All other components within normal limits  I-STAT CHEM 8, ED - Abnormal; Notable for the following components:   Creatinine, Ser 1.30 (*)    Glucose, Bld 192 (*)    Hemoglobin 8.5 (*)    HCT 25.0 (*)  All other components within normal limits  POC OCCULT BLOOD, ED - Abnormal; Notable for the following components:   Fecal Occult Bld POSITIVE (*)    All other components within normal limits  D-DIMER, QUANTITATIVE (NOT AT [facility])                                                                                                                         EKG  EKG Interpretation  Date/Time:    Ventricular Rate:    PR Interval:    QRS Duration:   QT Interval:    QTC Calculation:   R Axis:     Text Interpretation:        Radiology Dg Chest 2 View  Result Date: 04/28/2017 CLINICAL DATA:  Chest pain EXAM: CHEST  2 VIEW COMPARISON:  03/07/2017 FINDINGS: The heart size and mediastinal contours are within normal limits. Aortic atherosclerosis is identified. Both lungs are clear. The visualized skeletal structures are unremarkable. IMPRESSION: No active cardiopulmonary disease. Electronically Signed   By: Kerby Moors M.D.   On: 04/28/2017 11:02   Ct Angio Chest/abd/pel For Dissection W And/or Wo Contrast  Result Date: 04/28/2017 CLINICAL DATA:  Evaluate for aortic dissection. EXAM: CT ANGIOGRAPHY CHEST, ABDOMEN AND PELVIS TECHNIQUE: Multidetector CT imaging through the chest, abdomen and pelvis was performed using the standard protocol during bolus administration of intravenous contrast. Multiplanar reconstructed images and MIPs were obtained and reviewed to evaluate the vascular anatomy. CONTRAST:  174mL ISOVUE-370 IOPAMIDOL (ISOVUE-370) INJECTION 76% COMPARISON:  None  FINDINGS: CTA CHEST FINDINGS Cardiovascular: The heart size is normal. There is aortic atherosclerosis. Calcification within the LAD coronary artery identified. No evidence for thoracic aortic dissection or aneurysm. The main pulmonary artery is patent. No central obstructing embolus or saddle embolus identified. Mediastinum/Nodes: No enlarged mediastinal, hilar, or axillary lymph nodes. Thyroid gland, trachea, and esophagus demonstrate no significant findings. Lungs/Pleura: Mild changes of emphysema. No pleural effusions. Dependent changes identified overlying the lung bases. No pneumothorax. Calcified granuloma identified in the right middle lobe. Small ground-glass attenuating nodule in the right middle lobe measures 5 mm. Musculoskeletal: Spondylosis noted within the thoracic spine. Review of the MIP images confirms the above findings. CTA ABDOMEN AND PELVIS FINDINGS VASCULAR Aorta: Aortic atherosclerosis. No evidence for dissection. No aneurysm. Celiac: Patent. Calcification and narrowing at the origin of the celiac artery is identified, image 97 of series 10. SMA: Patent. Atherosclerotic calcifications and narrowing at the origin of the superior mesenteric artery noted. Renals: Renal arteries are patent. There is atherosclerotic calcification and mild narrowing involving the proximal renal arteries. IMA: Patent without evidence of aneurysm, dissection, vasculitis or significant stenosis. Inflow: Patent without evidence of aneurysm, dissection, vasculitis or significant stenosis. Veins: No obvious venous abnormality within the limitations of this arterial phase study. Review of the MIP images confirms the above findings. NON-VASCULAR Hepatobiliary: No focal liver abnormality is seen. Status post cholecystectomy. No biliary dilatation. Pancreas: Unremarkable. No pancreatic ductal dilatation or surrounding inflammatory changes. Spleen: Normal  in size without focal abnormality. Adrenals/Urinary Tract: Normal  adrenal glands. Mild bilateral renal cortical thinning identified. Lower pole cyst within the left kidney is identified measuring approximately 1.4 cm. No mass or hydronephrosis. The urinary bladder appears normal. Stomach/Bowel: The stomach is normal. The small bowel loops have a normal course and caliber. There is no pathologic dilatation of the colon. Numerous colonic diverticula noted without acute inflammation. Lymphatic: No enlarged upper abdominal lymph nodes. No pelvic or inguinal adenopathy. Reproductive: Seed implants are identified within the prostate gland. Other: There is no ascites or focal fluid collections within the abdomen or pelvis. Musculoskeletal: There is spondylosis noted throughout the lumbar spine. No suspicious bone lesions. Review of the MIP images confirms the above findings. IMPRESSION: 1. No evidence for aortic dissection. 2. Aortic atherosclerosis with atherosclerotic calcifications in the LAD coronary artery. Atherosclerotic calcifications with narrowing involving the origins of the celiac artery, SMA and renal arteries. 3. Right middle lobe ground-glass attenuating nodule. This measures 5 mm. No follow-up recommended. This recommendation follows the consensus statement: Guidelines for Management of Incidental Pulmonary Nodules Detected on CT Images: From the Fleischner Society 2017; Radiology 2017; 284:228-243. Electronically Signed   By: Kerby Moors M.D.   On: 04/28/2017 12:03   Pertinent labs & imaging results that were available during my care of the patient were reviewed by me and considered in my medical decision making (see chart for details).  Medications Ordered in ED Medications  furosemide (LASIX) injection 20 mg (not administered)  sodium chloride 0.9 % bolus 1,000 mL (1,000 mLs Intravenous New Bag/Given 04/28/17 1039)  iopamidol (ISOVUE-370) 76 % injection (100 mLs  Contrast Given 04/28/17 1122)                                                                                                                                     Procedures Procedures CRITICAL CARE Performed by: Grayce Sessions Lashante Fryberger Total critical care time: 45 minutes Critical care time was exclusive of separately billable procedures and treating other patients. Critical care was necessary to treat or prevent imminent or life-threatening deterioration. Critical care was time spent personally by me on the following activities: development of treatment plan with patient and/or surrogate as well as nursing, discussions with consultants, evaluation of patient's response to treatment, examination of patient, obtaining history from patient or surrogate, ordering and performing treatments and interventions, ordering and review of laboratory studies, ordering and review of radiographic studies, pulse oximetry and re-evaluation of patient's condition.   (including critical care time)  Medical Decision Making / ED Course I have reviewed the nursing notes for this encounter and the patient's prior records (if available in EHR or on provided paperwork).  Clinical Course as of Apr 28 1352  Sun Apr 28, 2017  1015 Upon arrival code STEMI was canceled as his pain is highly atypical and elevation is due to J-point elevation.  I spoke with Dr. Burt Knack who  is a STEMI doctor on today who agreed.  We will continue to perform our cardiac evaluation in addition to other workup.  Given his history of prior prostate cancer with MDS and pleuritic chest pain, I have suspicion for possible pulmonary embolism.   [PC]  1100 Chest x-ray with widened mediastinum.  No prior imaging for comparison.  Converted CTA PE study to a CTA dissection study.  [PC]  1200 CTA without evidence of dissection or pulmonary embolism.  There is mild pulmonary edema.  Still pending BNP.  [PC]  3536 Patient's pain may likely be MSK in nature given the quality and characteristics of his pain.  His elevated troponin may be demand ischemia from  either anemia or heart failure.  Patient was started on Lasix.  Discussed case with family medicine who will admit the patient for further workup and management.  Cardiology aware and will follow along during admission.  [PC]  1443 Do not feel that this is ACS related.  Patient's Hemoccult is positive and he is anemic thus we will hold off on any anticoagulation at this time.  [PC]    Clinical Course User Index [PC] Cadan Maggart, Grayce Sessions, MD       Final Clinical Impression(s) / ED Diagnoses Final diagnoses:  Chest pain  Elevated troponin  Anemia, unspecified type      This chart was dictated using voice recognition software.  Despite best efforts to proofread,  errors can occur which can change the documentation meaning.   Fatima Blank, MD 04/28/17 1355

## 2017-04-28 NOTE — Progress Notes (Signed)
   04/28/17 1100  Clinical Encounter Type  Visited With Patient;Health care provider  Visit Type ED  Referral From Nurse  Consult/Referral To Chaplain   Responded to a Code Stemi.  Patient arrived and indicated his son was on the way.  Let the front station know family on the way.  Code Stemi was canceled.  Will follow as needed. Chaplain Katherene Ponto

## 2017-04-28 NOTE — ED Notes (Signed)
Dinner tray ordered through service response.  

## 2017-04-28 NOTE — H&P (Signed)
[facility] Admission History and Physical Service Pager: [phone]  Patient name: Clayton Richards Medical record number: [id number] Date of birth: [birth year] Age: 84 y.o. Gender: male  Primary Care Provider: Chesley Noon, MD Consultants: Cardiology Code Status: DNR  Chief Complaint: chest pain  Assessment and Plan: Clayton Richards is a 84 y.o. male presenting with chest pain, here for ACS rule out. PMH is significant for T2DM, HTN, HLD, myelodysplastic syndrome, anxiety, Sjogren's syndrome, CKD III, hx prostate cancer.   Atypical Chest Pain: Chest pain seems to be musculoskeletal in etiology, as the pain is worse with any type of movement and better with rest. ACS felt to be less likely because chest pain is not exertional. EKG with ST elevations in the inferior leads, likely J point elevation. Heart score is a 4, I-stat troponin was 0.15, and CTA chest showed atherosclerotic calcifications in the LAD, so patient will need ACS rule out. Aortic dissection and PE ruled out with negative CTA chest.  - Admit to telemetry under inpatient status, attending Clayton Richards - Cardiology consulted, appreciate recommendations. Recommend trending troponins and will follow along. - Trend troponins - Repeat EKG now and again in the AM - ECHO ordered  - Risk stratification labs- TSH and A1c (had recent lipid panel) - Will continue Aspirin '81mg'$  - Start Coreg 3.'125mg'$  bid - Cardiac monitoring - Vitals per unit routine - PT/OT consult  Sinus Tachycardia: HR ranging from 88-144 in the ED. HR has been in the 80s-90s at recent office visits. May be due to anemia. PE ruled out with negative CTA chest. Recent TSH 02/2017 was normal. - Recheck TSH - Repeat EKG now and again the morning - Start Coreg 3.'125mg'$  bid - Cardiac monitoring  Dyspnea on Exertion: Has had some baseline DOE over the last few months that was notably worse this morning. No orthopnea or lower extremity  edema. Could be due to anemia, given his Hgb of 8.1. Could also be due to undiagnosed CHF. BNP 509.  - s/p Lasix '20mg'$  IV x 1 in the ED - Will obtain ECHO, as patient has never had this done  Normocytic Anemia / Myelodysplastic Syndrome: Follows with Clayton Richards as an outpatient. Had a bone marrow biopsy 03/07/17 that was consistent with myelodysplastic syndrome. Hgb 8.1 in the ED (baseline 8-9). FOBT positive, although patient hasn't noticed any melena or hematochezia at home. In care everywhere, it looks like patient did have a colonoscopy in 2013. Unable to see results, but per patient and family, the colonoscopy was negative. - Will recheck Hgb at 1800. If < 8.0, will transfuse. - Continue home ferrous sulfate '325mg'$  tid   T2DM: Last A1c 5.8 12/27/2016. Takes Metformin, Amaryl, and Januvia at home. - Hold Metformin, Amaryl, and Januvia while hospitalized - Sensitive SSI - CBGs with meals and at bedtime.  HTN: Normotensive in the ED. - Hold home Valsartan-HCTZ due to normal BPs and recent contrast load with CTA - Hold home Norvasc '5mg'$  daily due to normal BPs. Can restart if BPs become elevated  HLD: Last lipid panel 12/2016 with Chol 112, HDL 32, LDL 52, TG 137. - Continue home Lipitor '20mg'$  daily  CKD III: Cr 1.37 in the ED. Baseline 1.1-1.3. Patient did receive contrast in the ED. - Holding home Valsartan-HCTZ due to recent contrast load - Recheck BMP in the AM - Can likely restart home BP medications tomorrow.  Anxiety: stable - Continue home Wellbutrin '300mg'$  daily  Hx Prostate Cancer: stable. Per  patient, has been in remission since 2002. - Monitor  Protein-Calorie Malnutrition: Albumin 3.0. - Nutrition consult  FEN/GI: Heart healthy diet Prophylaxis: Holding DVT prophylaxis for anemia and FOBT positive; SCDs  Disposition: Admit to telemetry. Anticipate discharge home in 2-3 days.  History of Present Illness:  Clayton Richards is a 84 y.o. male presenting with chest pain. He  notes that he didn't feel well yesterday and didn't have a good appetite. He had a "pressure and pain on his chest and back" that started yesterday. He didn't sleep well and couldn't get comfortable. The pain is worse with movement or with taking a deep breath. The pain is better with laying still. The pain persisted this morning, so they called 911. He denies diaphoresis or nausea. He has some shortness of breath at baseline due to his anemia, but his shortness of breath was worse this morning. No orthopnea or lower extremity edema.  In the ED, he was normotensive and afebrile. HRs were elevated, ranging from 88-144. O2 saturations were between 94-98% on room air. Labs were significant for Na 134, Cr 1.37, Hgb 8.1. I-stat trop was 0.15. FOBT positive. BNP 509. CXR was negative. CTA chest did not show any aortic dissection or PE, but did show atherosclerotic calcifications in the LAD. EKG showed ST elevations in the inferior leads. Initially, a code STEMI was called but was then cancelled because patient's chest pain was atypical and it was felt that his EKG actually showed J point elevation. Cardiology was consulted by the ED and recommended trending troponins.   Review Of Systems: Per HPI with the following additions: see below  Review of Systems  Constitutional: Negative for chills and fever.  HENT: Negative for hearing loss.   Eyes: Negative for blurred vision and double vision.  Respiratory: Positive for shortness of breath. Negative for cough.   Cardiovascular: Positive for chest pain. Negative for palpitations, orthopnea and leg swelling.  Gastrointestinal: Positive for diarrhea. Negative for blood in stool, constipation, melena, nausea and vomiting.  Genitourinary: Negative for dysuria and urgency.  Musculoskeletal: Negative for joint pain and myalgias.  Neurological: Positive for dizziness. Negative for headaches.  Psychiatric/Behavioral: Negative for depression. The patient is not  nervous/anxious.     Patient Active Problem List   Diagnosis Date Noted  . Chest pain 04/28/2017  . Myelodysplastic syndrome ([employer]) 04/24/2017  . Anemia 02/13/2017  . Lymphopenia 02/13/2017  . Polyneuropathy, peripheral sensorimotor axonal 08/08/2016  . Diabetes ([place]) 08/08/2016    Past Medical History: Past Medical History:  Diagnosis Date  . Anemia   . Anxiety   . Depression   . Diabetes mellitus without complication ([place])   . Hyperlipidemia   . Hypertension   . Neuropathy   . Prostate CA [facility])     Past Surgical History: Past Surgical History:  Procedure Laterality Date  . CATARACT EXTRACTION  2016  . CHOLECYSTECTOMY  1975  . PROSTATE SURGERY  2002    Social History: Social History   Tobacco Use  . Smoking status: Former Smoker    Last attempt to quit: 1988    Years since quitting: 31.1  . Smokeless tobacco: Never Used  Substance Use Topics  . Alcohol use: No    Alcohol/week: 0.0 oz  . Drug use: No   Additional social history: lives at home with his wife. Uses a walker at home. Please also refer to relevant sections of EMR.  Family History: Mother- diabetes Father- foot amputation  Allergies and Medications: No Known Allergies  No current facility-administered medications on file prior to encounter.    Current Outpatient Medications on File Prior to Encounter  Medication Sig Dispense Refill  . amLODipine-atorvastatin (CADUET) 5-20 MG per tablet Take 1 tablet by mouth daily.    Marland Kitchen aspirin 81 MG tablet Take 81 mg by mouth daily.    Marland Kitchen buPROPion (WELLBUTRIN XL) 300 MG 24 hr tablet Take 300 mg by mouth daily.    . calcium carbonate (OS-CAL) 600 MG TABS tablet Take 600 mg by mouth 2 (two) times daily with a meal.    . Cholecalciferol (VITAMIN D) 2000 units CAPS Take 2,000 Units by mouth daily.    . Cyanocobalamin (VITAMIN B 12 PO) Take 5,000 mcg by mouth daily.    . ferrous sulfate 325 (65 FE) MG tablet Take 325 mg by mouth 3 (three) times daily with meals.      Marland Kitchen glimepiride (AMARYL) 4 MG tablet Take 4 mg by mouth daily with breakfast.    . metFORMIN (GLUCOPHAGE) 1000 MG tablet Take 1,000 mg by mouth 2 (two) times daily with a meal.    . Omega-3 Fatty Acids (OMEGA-3 FISH OIL PO) Take by mouth.    . Probiotic Product (ALIGN PO) Take 1 tablet by mouth daily.    . sitaGLIPtin (JANUVIA) 50 MG tablet Take 50 mg by mouth daily.    . Sodium Bicarbonate-Citric Acid (ALKA-SELTZER HEARTBURN PO) Take 1 tablet by mouth as needed (indigestion).    . valsartan-hydrochlorothiazide (DIOVAN-HCT) 320-12.5 MG per tablet Take 1 tablet by mouth daily.      Objective: BP 115/77   Pulse (!) 106   Temp 98.5 F (36.9 C) (Oral)   Resp (!) 26   Ht 5' 10" (1.778 m)   Wt 132 lb (59.9 kg)   SpO2 95%   BMI 18.94 kg/m  Exam: General: Laying in bed, in NAD, pleasant Eyes: EOMI, PERRLA, no scleral icterus, pale conjunctiva ENTM: Nose normal, MMM Neck: supple, full ROM, no cervical lymphadenopathy Cardiovascular: Tachycardic, regular rhythm, no murmurs, no tenderness to palpation of the sternum. Respiratory: CTAB, normal work of breathing, taking somewhat shallow breaths Gastrointestinal: +BS, soft, non-tender, non-distended MSK: Warm and well-perfused, no lower extremity edema Derm: No rashes or lesions on exposed skin Neuro: Awake, alert, oriented, CN 2-12 intact, no focal deficits Psych: Appropriate affect, normal thought content, normal judgment.  Labs and Imaging: CBC BMET  Recent Labs  Lab 04/28/17 1026 04/28/17 1106  WBC 9.1  --   HGB 8.1* 8.5*  HCT 25.8* 25.0*  PLT 235  --    Recent Labs  Lab 04/28/17 1026 04/28/17 1106  NA 134* 135  K 4.3 4.2  CL 103 101  CO2 19*  --   BUN 19 20  CREATININE 1.37* 1.30*  GLUCOSE 197* 192*  CALCIUM 8.8*  --      -I-stat trop was 0.15 -FOBT positive -BNP 509  -CXR: negative -CTA chest: no aortic dissection; atherosclerotic calcifications in the LAD -EKG: ST elevations in the inferior leads  Cashis Rill,  Pete Pelt, MD 04/28/2017, 2:40 PM PGY-3, [place] Intern pager: [phone], text pages welcome

## 2017-04-28 NOTE — ED Notes (Signed)
Stemi doctor paged to Dr. Leonette Monarch per his request

## 2017-04-28 NOTE — ED Notes (Signed)
ED Provider at bedside. 

## 2017-04-28 NOTE — ED Triage Notes (Signed)
Patient at home when he felt chest and back pain when taking deep breaths. Patient also complains of muscle hurting when moving.

## 2017-04-29 ENCOUNTER — Encounter (HOSPITAL_COMMUNITY): Payer: Self-pay

## 2017-04-29 ENCOUNTER — Inpatient Hospital Stay (HOSPITAL_COMMUNITY): Payer: Medicare Other

## 2017-04-29 DIAGNOSIS — R9431 Abnormal electrocardiogram [ECG] [EKG]: Secondary | ICD-10-CM

## 2017-04-29 DIAGNOSIS — I480 Paroxysmal atrial fibrillation: Secondary | ICD-10-CM

## 2017-04-29 DIAGNOSIS — D649 Anemia, unspecified: Secondary | ICD-10-CM

## 2017-04-29 DIAGNOSIS — N179 Acute kidney failure, unspecified: Secondary | ICD-10-CM

## 2017-04-29 DIAGNOSIS — R0789 Other chest pain: Principal | ICD-10-CM

## 2017-04-29 DIAGNOSIS — I361 Nonrheumatic tricuspid (valve) insufficiency: Secondary | ICD-10-CM

## 2017-04-29 DIAGNOSIS — I4891 Unspecified atrial fibrillation: Secondary | ICD-10-CM

## 2017-04-29 LAB — BASIC METABOLIC PANEL
ANION GAP: 12 (ref 5–15)
BUN: 27 mg/dL — ABNORMAL HIGH (ref 6–20)
CALCIUM: 8.1 mg/dL — AB (ref 8.9–10.3)
CO2: 17 mmol/L — ABNORMAL LOW (ref 22–32)
Chloride: 104 mmol/L (ref 101–111)
Creatinine, Ser: 1.99 mg/dL — ABNORMAL HIGH (ref 0.61–1.24)
GFR, EST AFRICAN AMERICAN: 33 mL/min — AB (ref >60)
GFR, EST NON AFRICAN AMERICAN: 28 mL/min — AB (ref >60)
GLUCOSE: 182 mg/dL — AB (ref 65–99)
Potassium: 4.5 mmol/L (ref 3.5–5.1)
Sodium: 133 mmol/L — ABNORMAL LOW (ref 135–145)

## 2017-04-29 LAB — CBC
HCT: 27.4 % — ABNORMAL LOW (ref 39.0–52.0)
Hemoglobin: 8.6 g/dL — ABNORMAL LOW (ref 13.0–17.0)
MCH: 29.2 pg (ref 26.0–34.0)
MCHC: 31.4 g/dL (ref 30.0–36.0)
MCV: 92.9 fL (ref 78.0–100.0)
PLATELETS: 184 10*3/uL (ref 150–400)
RBC: 2.95 MIL/uL — ABNORMAL LOW (ref 4.22–5.81)
RDW: 16.5 % — AB (ref 11.5–15.5)
WBC: 12.1 10*3/uL — AB (ref 4.0–10.5)

## 2017-04-29 LAB — ABO/RH: ABO/RH(D): A POS

## 2017-04-29 LAB — GLUCOSE, CAPILLARY
GLUCOSE-CAPILLARY: 157 mg/dL — AB (ref 65–99)
GLUCOSE-CAPILLARY: 158 mg/dL — AB (ref 65–99)
Glucose-Capillary: 128 mg/dL — ABNORMAL HIGH (ref 65–99)
Glucose-Capillary: 188 mg/dL — ABNORMAL HIGH (ref 65–99)

## 2017-04-29 LAB — TROPONIN I: TROPONIN I: 0.05 ng/mL — AB (ref <0.03)

## 2017-04-29 LAB — ECHOCARDIOGRAM COMPLETE
Height: 70 in
WEIGHTICAEL: 2211.65 [oz_av]

## 2017-04-29 MED ORDER — PANTOPRAZOLE SODIUM 40 MG PO TBEC
40.0000 mg | DELAYED_RELEASE_TABLET | Freq: Two times a day (BID) | ORAL | Status: DC
  Administered 2017-04-29 – 2017-05-01 (×5): 40 mg via ORAL
  Filled 2017-04-29 (×5): qty 1

## 2017-04-29 MED ORDER — TRAZODONE HCL 50 MG PO TABS
50.0000 mg | ORAL_TABLET | Freq: Once | ORAL | Status: AC
  Administered 2017-04-29: 50 mg via ORAL
  Filled 2017-04-29: qty 1

## 2017-04-29 MED ORDER — GLUCERNA SHAKE PO LIQD
237.0000 mL | Freq: Three times a day (TID) | ORAL | Status: DC
  Administered 2017-04-29 – 2017-05-01 (×4): 237 mL via ORAL

## 2017-04-29 MED ORDER — PANTOPRAZOLE SODIUM 40 MG PO TBEC: 40.0000 mg | DELAYED_RELEASE_TABLET | Freq: Every day | ORAL | Status: DC

## 2017-04-29 NOTE — Evaluation (Signed)
Occupational Therapy Evaluation Patient Details Name: Clayton Richards MRN: [id number] DOB: 08-29-1928 Today's Date: 04/29/2017    History of Present Illness Pt is an 84 y.o. male who was admitted with chest pain. He has a PMH significant for anemia, anxiety, depression, diabetes mellitus without complication, hyperlipidemia, hypertension, neuropathy, nyelodysplastic syndrome, anxiety, Sjogren's syndrome, CKD III, and prostate cancer.    Clinical Impression   PTA, pt reports modified independence with ADL and functional mobility utilizing a RW or cane throughout daily tasks. He currently is limited by fatigue, generalized weakness, decreased activity tolerance for ADL, and decreased safety with RW use. He requires min assist for LB ADL and min guard assist for toilet transfers. Pt requiring consistent cueing for safety when utilizing RW. He would benefit from continued OT services while admitted to improve independence and safety with ADL and functional mobility. Additionally recommend home health OT follow-up post-acute D/C.     Follow Up Recommendations  Home health OT;Supervision/Assistance - 24 hour    Equipment Recommendations  None recommended by OT    Recommendations for Other Services       Precautions / Restrictions Precautions Precautions: Fall Restrictions Weight Bearing Restrictions: No      Mobility Bed Mobility Overal bed mobility: Needs Assistance Bed Mobility: Rolling;Sidelying to Sit Rolling: Supervision Sidelying to sit: Supervision       General bed mobility comments: cues for technique  Transfers Overall transfer level: Needs assistance Equipment used: Rolling walker (2 wheeled) Transfers: Sit to/from Stand Sit to Stand: Min guard;From elevated surface         General transfer comment: Min guard assist from elevated bed surface    Balance Overall balance assessment: Needs assistance Sitting-balance support: No upper extremity supported;Feet  supported Sitting balance-Leahy Scale: Good     Standing balance support: Bilateral upper extremity supported;During functional activity;Single extremity supported Standing balance-Leahy Scale: Poor Standing balance comment: relies on UE support                           ADL either performed or assessed with clinical judgement   ADL Overall ADL's : Needs assistance/impaired Eating/Feeding: Set up;Sitting   Grooming: Min guard;Standing   Upper Body Bathing: Set up;Sitting   Lower Body Bathing: Minimal assistance;Sit to/from stand   Upper Body Dressing : Set up;Sitting   Lower Body Dressing: Minimal assistance;Sit to/from stand   Toilet Transfer: Medical laboratory scientific officer Details (indicate cue type and reason): Poor coordination noted with gait Toileting- Water quality scientist and Hygiene: Min guard;Sit to/from stand       Functional mobility during ADLs: Min guard;Rolling walker General ADL Comments: Requires cues for safe RW use     Vision Patient Visual Report: No change from baseline Vision Assessment?: No apparent visual deficits     Perception     Praxis      Pertinent Vitals/Pain Pain Assessment: No/denies pain     Hand Dominance     Extremity/Trunk Assessment Upper Extremity Assessment Upper Extremity Assessment: Generalized weakness   Lower Extremity Assessment Lower Extremity Assessment: Generalized weakness       Communication Communication Communication: No difficulties   Cognition Arousal/Alertness: Awake/alert Behavior During Therapy: WFL for tasks assessed/performed Overall Cognitive Status: Within Functional Limits for tasks assessed  General Comments  SpO2 monitor with difficulty picking up reading. Did demonstrate desatruation to mid 37s with quick rebond to 91% with readjustment of sensor.    Exercises     Shoulder Instructions      Home  Living Family/patient expects to be discharged to:: Private residence Living Arrangements: Spouse/significant other Available Help at Discharge: Family;Available 24 hours/day(wife)         Home Layout: Two level;Bed/bath upstairs     Bathroom Shower/Tub: Walk-in Psychologist, prison and probation services: Standard     Home Equipment: Environmental consultant - 2 wheels;Cane - single point;Grab bars - tub/shower;Shower seat          Prior Functioning/Environment Level of Independence: Independent with assistive device(s)        Comments: uses either RW or cane; drives        OT Problem List: Decreased strength;Decreased activity tolerance;Impaired balance (sitting and/or standing);Decreased safety awareness;Decreased knowledge of use of DME or AE;Decreased knowledge of precautions      OT Treatment/Interventions: Self-care/ADL training;Therapeutic exercise;Energy conservation;DME and/or AE instruction;Therapeutic activities;Patient/family education;Balance training    OT Goals(Current goals can be found in the care plan section) Acute Rehab OT Goals Patient Stated Goal: feel less tired OT Goal Formulation: With patient Time For Goal Achievement: 05/13/17 Potential to Achieve Goals: Good ADL Goals Pt Will Perform Grooming: with modified independence;standing Pt Will Perform Lower Body Dressing: with modified independence;sit to/from stand Pt Will Transfer to Toilet: with modified independence;ambulating;regular height toilet Pt Will Perform Toileting - Clothing Manipulation and hygiene: with modified independence;sit to/from stand Pt Will Perform Tub/Shower Transfer: Shower transfer;with modified independence;rolling walker;shower seat;grab bars  OT Frequency: Min 2X/week   Barriers to D/C:            Co-evaluation              AM-PAC PT "6 Clicks" Daily Activity     Outcome Measure Help from another person eating meals?: A Little Help from another person taking care of personal grooming?: A  Little Help from another person toileting, which includes using toliet, bedpan, or urinal?: A Little Help from another person bathing (including washing, rinsing, drying)?: A Little Help from another person to put on and taking off regular upper body clothing?: A Little Help from another person to put on and taking off regular lower body clothing?: A Little 6 Click Score: 18   End of Session Equipment Utilized During Treatment: Rolling walker  Activity Tolerance: Patient tolerated treatment well Patient left: in chair;with call bell/phone within reach;with chair alarm set  OT Visit Diagnosis: Other abnormalities of gait and mobility (R26.89);Muscle weakness (generalized) (M62.81)                Time: 3295-1884 OT Time Calculation (min): 28 min Charges:  OT General Charges $OT Visit: 1 Visit OT Evaluation $OT Eval Moderate Complexity: 1 Mod OT Treatments $Self Care/Home Management : 8-22 mins G-Codes:     Norman Herrlich, MS OTR/L  Pager: Alma A Dorotha Hirschi 04/29/2017, 12:53 PM

## 2017-04-29 NOTE — Consult Note (Signed)
Cardiology Consultation:   Patient ID: Clayton Richards; [id number]; 1928/03/17   Admit date: 04/28/2017 Date of Consult: 04/29/2017  Primary Care Provider: Chesley Noon, MD Primary Cardiologist: New to Dr. Claiborne Billings   Patient Profile:   Clayton Richards is a 84 y.o. male with a hx of myelodysplastic syndrome, HTN, DM, HLD, CKD stage III who is being seen today for the evaluation of chest pain and abnormal EKG at the request of Dr. Mingo Amber.   No prior hx of CAD or MI. Former smoker, quit 30 years ago.   History of Present Illness:   Mr. Furr presented for evaluation of chest pain. Patient had intermittent substernal chest pressure saturday afternoon. Exacerbated with deep breath. Any movement makes his pain radiating to his back. His pain lasted intermittently and came to ER yesterday for further evaluation. Pain did not improved with nitro via en route by EMS. No prior hx of similar symptoms. He has chronic dyspnea due to MDS.   No dissection on CTA of chest. FOBT positive. Noted hgb of less than 7 s/p 1 unit of PRBC. Now hemoglobin improved to > 8. Troponin 0.08>>0.09 x2 >>> 0.05. SCr of 1.99. BNP of 509.7.   EKG yesterday showed sinus tachycardia/afib with non specific J point elevation diffusely. EKG This morning showed atrial fibrillation with diffuse ST elevated globally.   He denies fever, chills, cough and congestion. No recurrent chest pressure however he gets back pain with cough. He has chronic dyspnea and dizziness with standing from sitting position but over the years he has learned to get balance. However it feels better after few steps. Lives with wife and independent.  Past Medical History:  Diagnosis Date  . Anemia   . Anxiety   . Depression   . Diabetes mellitus without complication ([place])   . Hyperlipidemia   . Hypertension   . Neuropathy   . Prostate CA [facility])     Past Surgical History:  Procedure Laterality Date  . CATARACT EXTRACTION  2016  .  CHOLECYSTECTOMY  1975  . PROSTATE SURGERY  2002    Inpatient Medications: Scheduled Meds: . aspirin EC  81 mg Oral Daily  . buPROPion  300 mg Oral Daily  . carvedilol  3.125 mg Oral BID WC  . ferrous sulfate  325 mg Oral TID WC  . insulin aspart  0-5 Units Subcutaneous QHS  . insulin aspart  0-9 Units Subcutaneous TID WC  . pantoprazole  40 mg Oral BID   Continuous Infusions:  PRN Meds: acetaminophen, ondansetron (ZOFRAN) IV  Allergies:   No Known Allergies  Social History:   Social History   Socioeconomic History  . Marital status: Married    Spouse name: Not on file  . Number of children: 3  . Years of education: 71  . Highest education level: Not on file  Social Needs  . Financial resource strain: Not on file  . Food insecurity - worry: Not on file  . Food insecurity - inability: Not on file  . Transportation needs - medical: Not on file  . Transportation needs - non-medical: Not on file  Occupational History  . Occupation: Retired  Tobacco Use  . Smoking status: Former Smoker    Last attempt to quit: 1988    Years since quitting: 31.1  . Smokeless tobacco: Never Used  Substance and Sexual Activity  . Alcohol use: No    Alcohol/week: 0.0 oz  . Drug use: No  . Sexual activity: Not on file  Other Topics Concern  . Not on file  Social History Narrative   Lives at home w/ his wife   Right-handed   Caffeine: occasional decaf coffee    Family History:   Denies family hx of CAD  ROS:  Please see the history of present illness.  All other ROS reviewed and negative.     Physical Exam/Data:   Vitals:   04/29/17 0557 04/29/17 0759 04/29/17 0800 04/29/17 0848  BP: (!) 98/56  (!) 92/57 116/67  Pulse: 77  93 (!) 102  Resp:      Temp:  98.4 F (36.9 C)    TempSrc:  Oral    SpO2: 95%  94%   Weight:      Height:        Intake/Output Summary (Last 24 hours) at 04/29/2017 0942 Last data filed at 04/29/2017 0925 Gross per 24 hour  Intake 318 ml  Output  200 ml  Net 118 ml   Filed Weights   04/28/17 1013 04/28/17 2027 04/29/17 0443  Weight: 132 lb (59.9 kg) 132 lb 0.9 oz (59.9 kg) 138 lb 3.7 oz (62.7 kg)   Body mass index is 19.83 kg/m.  General: Thin frail ill appearing male in no acute distress HEENT: normal Lymph: no adenopathy Neck: no JVD Endocrine:  No thryomegaly Vascular: No carotid bruits; FA pulses 2+ bilaterally without bruits  Cardiac:  normal S1, S2; RRR; no murmur  Lungs:  clear to auscultation bilaterally, no wheezing, rhonchi or rales  Abd: soft, nontender, no hepatomegaly  Ext: no edema Musculoskeletal:  No deformities, BUE and BLE strength normal and equal Skin: warm and dry  Neuro:  CNs 2-12 intact, no focal abnormalities noted Psych:  Normal affect   Telemetry:  Telemetry was personally reviewed and demonstrates: Sinus rhythm at controlled rate currently   Relevant CV Studies: Pending echo reading   Laboratory Data:  Chemistry Recent Labs  Lab 04/28/17 1026 04/28/17 1106 04/29/17 0441  NA 134* 135 133*  K 4.3 4.2 4.5  CL 103 101 104  CO2 19*  --  17*  GLUCOSE 197* 192* 182*  BUN 19 20 27*  CREATININE 1.37* 1.30* 1.99*  CALCIUM 8.8*  --  8.1*  GFRNONAA 45*  --  28*  GFRAA 52*  --  33*  ANIONGAP 12  --  12    Recent Labs  Lab 04/28/17 1026  PROT 6.8  ALBUMIN 3.0*  AST 21  ALT 13*  ALKPHOS 61  BILITOT 1.0   Hematology Recent Labs  Lab 04/24/17 1248  04/28/17 1026 04/28/17 1106 04/28/17 1858 04/29/17 0441  WBC 3.4*  --  9.1  --   --  12.1*  RBC 2.71*  --  2.75*  --   --  2.95*  HGB  --    < > 8.1* 8.5* 7.8* 8.6*  HCT 25.2*  --  25.8* 25.0* 24.9* 27.4*  MCV 93.0  --  93.8  --   --  92.9  MCH 29.9  --  29.5  --   --  29.2  MCHC 32.2  --  31.4  --   --  31.4  RDW 17.8*  --  17.2*  --   --  16.5*  PLT 226  --  235  --   --  184   < > = values in this interval not displayed.   Cardiac Enzymes Recent Labs  Lab 04/28/17 1602 04/28/17 1858 04/28/17 2056 04/29/17 0754    TROPONINI 0.08* 0.09*  0.09* 0.05*    Recent Labs  Lab 04/28/17 1035  TROPIPOC 0.15*    BNP Recent Labs  Lab 04/28/17 1026  BNP 509.7*    Lipid Panel     Component Value Date/Time   CHOL 91 04/28/2017 1858   TRIG 88 04/28/2017 1858   HDL 25 (L) 04/28/2017 1858   CHOLHDL 3.6 04/28/2017 1858   VLDL 18 04/28/2017 1858   LDLCALC 48 04/28/2017 1858   Radiology/Studies:  Dg Chest 2 View  Result Date: 04/28/2017 CLINICAL DATA:  Chest pain EXAM: CHEST  2 VIEW COMPARISON:  03/07/2017 FINDINGS: The heart size and mediastinal contours are within normal limits. Aortic atherosclerosis is identified. Both lungs are clear. The visualized skeletal structures are unremarkable. IMPRESSION: No active cardiopulmonary disease. Electronically Signed   By: Kerby Moors M.D.   On: 04/28/2017 11:02   Ct Angio Chest/abd/pel For Dissection W And/or Wo Contrast  Result Date: 04/28/2017 CLINICAL DATA:  Evaluate for aortic dissection. EXAM: CT ANGIOGRAPHY CHEST, ABDOMEN AND PELVIS TECHNIQUE: Multidetector CT imaging through the chest, abdomen and pelvis was performed using the standard protocol during bolus administration of intravenous contrast. Multiplanar reconstructed images and MIPs were obtained and reviewed to evaluate the vascular anatomy. CONTRAST:  161mL ISOVUE-370 IOPAMIDOL (ISOVUE-370) INJECTION 76% COMPARISON:  None FINDINGS: CTA CHEST FINDINGS Cardiovascular: The heart size is normal. There is aortic atherosclerosis. Calcification within the LAD coronary artery identified. No evidence for thoracic aortic dissection or aneurysm. The main pulmonary artery is patent. No central obstructing embolus or saddle embolus identified. Mediastinum/Nodes: No enlarged mediastinal, hilar, or axillary lymph nodes. Thyroid gland, trachea, and esophagus demonstrate no significant findings. Lungs/Pleura: Mild changes of emphysema. No pleural effusions. Dependent changes identified overlying the lung bases. No  pneumothorax. Calcified granuloma identified in the right middle lobe. Small ground-glass attenuating nodule in the right middle lobe measures 5 mm. Musculoskeletal: Spondylosis noted within the thoracic spine. Review of the MIP images confirms the above findings. CTA ABDOMEN AND PELVIS FINDINGS VASCULAR Aorta: Aortic atherosclerosis. No evidence for dissection. No aneurysm. Celiac: Patent. Calcification and narrowing at the origin of the celiac artery is identified, image 97 of series 10. SMA: Patent. Atherosclerotic calcifications and narrowing at the origin of the superior mesenteric artery noted. Renals: Renal arteries are patent. There is atherosclerotic calcification and mild narrowing involving the proximal renal arteries. IMA: Patent without evidence of aneurysm, dissection, vasculitis or significant stenosis. Inflow: Patent without evidence of aneurysm, dissection, vasculitis or significant stenosis. Veins: No obvious venous abnormality within the limitations of this arterial phase study. Review of the MIP images confirms the above findings. NON-VASCULAR Hepatobiliary: No focal liver abnormality is seen. Status post cholecystectomy. No biliary dilatation. Pancreas: Unremarkable. No pancreatic ductal dilatation or surrounding inflammatory changes. Spleen: Normal in size without focal abnormality. Adrenals/Urinary Tract: Normal adrenal glands. Mild bilateral renal cortical thinning identified. Lower pole cyst within the left kidney is identified measuring approximately 1.4 cm. No mass or hydronephrosis. The urinary bladder appears normal. Stomach/Bowel: The stomach is normal. The small bowel loops have a normal course and caliber. There is no pathologic dilatation of the colon. Numerous colonic diverticula noted without acute inflammation. Lymphatic: No enlarged upper abdominal lymph nodes. No pelvic or inguinal adenopathy. Reproductive: Seed implants are identified within the prostate gland. Other: There is  no ascites or focal fluid collections within the abdomen or pelvis. Musculoskeletal: There is spondylosis noted throughout the lumbar spine. No suspicious bone lesions. Review of the MIP images confirms the above findings. IMPRESSION: 1.  No evidence for aortic dissection. 2. Aortic atherosclerosis with atherosclerotic calcifications in the LAD coronary artery. Atherosclerotic calcifications with narrowing involving the origins of the celiac artery, SMA and renal arteries. 3. Right middle lobe ground-glass attenuating nodule. This measures 5 mm. No follow-up recommended. This recommendation follows the consensus statement: Guidelines for Management of Incidental Pulmonary Nodules Detected on CT Images: From the Fleischner Society 2017; Radiology 2017; 284:228-243. Electronically Signed   By: Kerby Moors M.D.   On: 04/28/2017 12:03    Assessment and Plan:   1. Chest pain - Seems atypical. Worse with movement and deep breath. More pleuritic in nature in setting of anemia and minimally elevated WBC. No fever or chills. Has cough.  - No dissection on CTA of chest but showed atherosclerotic calcifications in the LAD coronary artery. - EKG with diffuse ST elevation, more pronounced today. ? Repolarization abnormality. Troponin not in ACE pattern. Consistent with demand ischemia in setting of anemia and AKI. Says pain medication helped to relieve his pain.  - He certainly can have CAD but not a candidate for invasive evaluation. He is DNR and not intrested any evaluation. Pending echo to guide medical therapy and r/o any effusion. ? Pericarditis. Consider r/o PE as well.  - LDL excellently controlled at 48. Started on ASA this admission (long term tx per oncologist/primary).  - Continue BB.   2. New onset paroxysmal atrial fibrillation - Rate controlled. Currently in sinus on tele. Continue BB. Not an anticoagulant candidate. CHADSVASC score of 4.   3. HTN - BP was well controlled yesterday. However low  this morning. Continue low dose coreg for now. Home antihypertensive on hold.  4. DM - per primary team  5. Acute anemia with MDS and + FOBT - s/p transfused 1 unit of PRBCs. Per primary team  6. Acute on CKD stage III - Scr of 1.99. Baseline around 1.2-1.3. Avoid nephrotoxic agent.   For questions or updates, please contact [place] Please consult [email or web address] for contact info under Cardiology/STEMI.   [place] Longest [place], [place]  04/29/2017 9:42 AM    Patient seen and examined. Agree with assessment and plan.  Mr. Norton Bivins is an 84 year old Caucasian male who has a history of hypertension, type 2 diabetes mellitus, hyperlipidemia, renal insufficiency, and myelodysplastic syndrome.  Over the weekend, he had admitted to occasional episodes of chest discomfort which may have gotten worse with taking a deep breath.  He also had pain in his upper back.  He was admitted via the medicine service.  There was no evidence for dissection.  He was significantly anemic with hemoglobin less than 7 and underwent pack red blood cell transfusion 1 with resolution of symptoms.  Initial troponins were minimally increased in a plateau pattern suggestive of demand ischemia rather than non-ST segment elevation MI.  He also has worsening renal function with a creatinine that  increased to 1.99.  Initial ECG showed sinus tachycardia with mild J-point elevation inferolaterally.  ECG today has shown atrial fibrillation with more pronounced global J pont elevation  suggestive of early repolarization or possible pericardial involvement rather than acute ST segment elevation myocardial infarction.  At present, the patient is entirely pain-free.  He denies positional component to his chest pain.  When I was in the room examining the patient, on telemetry, it appeared that he also has converted back to sinus rhythm and his ventricular rate was in the 70s.  Agree with beta blocker therapy  He has been found to  have guaiac-positive stools and with his anemia, systemic anticoagulation was deferred.  He is on aspirin daily.  Chest CT did not demonstrate any dissection.  He was found to have aortic atherosclerosis.  Target LDL is less than 70, and on most recent lipid studies, LDL was 48.  A 2-D echo Doppler study has been ordered to assess for LV function, valvular architecture, the pericardial issues.  I did not appreciate any pericardial friction rub on exam.  At present, with the patient's significant anemia, renal insufficiency, flat troponin curve, and absence of ischemic sounding chest pain would not pursue cardiac catheterization. Will follow.   Troy Sine, MD, [facility] 04/29/2017 10:53 AM

## 2017-04-29 NOTE — Progress Notes (Signed)
Received a call from telemonitor tech, pt has converted into afib, rate in the 70s-80s. Denies chest pain, and otherwise asymptomatic. Vitals stable at this time. MD [facility] made aware, no new orders received at this time. Will continue to monitor.

## 2017-04-29 NOTE — Progress Notes (Signed)
Spoke with patient over the phone regarding his new diagnosis of atrial fibrillation and the risk and benefits of anticoagulation to prevent stroke.  Given his profound anemia in the setting of myelodysplastic disorder and his possible GI bleed found on admission, cardiology did not recommend anticoagulation.  I told the patient starting anticoagulation in this the setting of his possible GI bleed and his low baseline anemia had a high risk of death. Patient said that he would still rather be anticoagulated to prevent stroke as he would like to "go fast" in terms of his death instead of persisting with profound complications that are common after CVA such as hemiparesis, difficulty speaking, etc. I also told him he could continue the discussion with his primary care provider in the outpatient setting and that this did not had to be resolved during his inpatient admission.  He, however, felt that he wanted to be anticoagulated during this hospitalization.  I will discuss anticoagulation therapy with the team in the a.m.

## 2017-04-29 NOTE — Evaluation (Signed)
Physical Therapy Evaluation Patient Details Name: Clayton Richards MRN: [id number] DOB: 13-Apr-1928 Today's Date: 04/29/2017   History of Present Illness  Pt is an 84 y.o. male who was admitted with chest pain. He has a PMH significant for anemia, anxiety, depression, diabetes mellitus without complication, hyperlipidemia, hypertension, neuropathy, nyelodysplastic syndrome, anxiety, Sjogren's syndrome, CKD III, and prostate cancer.   Clinical Impression  Pt admitted with/for Chest pain.  Pt currently needing min guard in a home-like environment and is not quite back to his baseline..  Pt currently limited functionally due to the problems listed below.  (see problems list.)  Pt will benefit from PT to maximize function and safety to be able to get home safely with available assist from his wife..     Follow Up Recommendations Home health PT;Supervision/Assistance - 24 hour;Other (comment)(24 hour initially)    Equipment Recommendations  None recommended by PT    Recommendations for Other Services       Precautions / Restrictions Precautions Precautions: Fall Restrictions Weight Bearing Restrictions: No      Mobility  Bed Mobility Overal bed mobility: Needs Assistance Bed Mobility: Rolling;Sidelying to Sit Rolling: Supervision Sidelying to sit: Supervision       General bed mobility comments: in the recliner on arrival  Transfers Overall transfer level: Needs assistance Equipment used: Rolling walker (2 wheeled) Transfers: Sit to/from Stand Sit to Stand: Min guard;From elevated surface         General transfer comment: Min guard assist from the recliner with use of arm rests.  Cues for better safety.  Ambulation/Gait Ambulation/Gait assistance: Min guard Ambulation Distance (Feet): 30 Feet Assistive device: Rolling walker (2 wheeled) Gait Pattern/deviations: Step-through pattern   Gait velocity interpretation: Below normal speed for age/gender General Gait  Details: gait generally characterized as steppage gait pattern with bil foot drop  Stairs            Wheelchair Mobility    Modified Rankin (Stroke Patients Only)       Balance Overall balance assessment: Needs assistance Sitting-balance support: No upper extremity supported;Feet supported Sitting balance-Leahy Scale: Good     Standing balance support: Bilateral upper extremity supported;During functional activity;Single extremity supported Standing balance-Leahy Scale: Poor Standing balance comment: relies on UE support                             Pertinent Vitals/Pain Pain Assessment: No/denies pain    Home Living Family/patient expects to be discharged to:: Private residence Living Arrangements: Spouse/significant other Available Help at Discharge: Family;Available 24 hours/day(wife)         Home Layout: Two level;Bed/bath upstairs Home Equipment: Walker - 2 wheels;Cane - single point;Grab bars - tub/shower;Shower seat      Prior Function Level of Independence: Independent with assistive device(s)         Comments: uses either RW or cane; drives     Hand Dominance        Extremity/Trunk Assessment   Upper Extremity Assessment Upper Extremity Assessment: Generalized weakness    Lower Extremity Assessment Lower Extremity Assessment: Generalized weakness       Communication   Communication: No difficulties  Cognition Arousal/Alertness: Awake/alert Behavior During Therapy: WFL for tasks assessed/performed Overall Cognitive Status: Within Functional Limits for tasks assessed  General Comments General comments (skin integrity, edema, etc.): sats in the low to mid 90's overall.    Exercises     Assessment/Plan    PT Assessment Patient needs continued PT services  PT Problem List Decreased strength;Decreased balance;Decreased activity tolerance;Decreased mobility;Impaired  sensation       PT Treatment Interventions Gait training;Functional mobility training;Therapeutic activities;Patient/family education;Balance training    PT Goals (Current goals can be found in the Care Plan section)  Acute Rehab PT Goals Patient Stated Goal: feel less tired PT Goal Formulation: With patient Time For Goal Achievement: 05/06/17 Potential to Achieve Goals: Fair    Frequency Min 3X/week   Barriers to discharge        Co-evaluation               AM-PAC PT "6 Clicks" Daily Activity  Outcome Measure Difficulty turning over in bed (including adjusting bedclothes, sheets and blankets)?: None Difficulty moving from lying on back to sitting on the side of the bed? : None Difficulty sitting down on and standing up from a chair with arms (e.g., wheelchair, bedside commode, etc,.)?: A Little Help needed moving to and from a bed to chair (including a wheelchair)?: A Little Help needed walking in hospital room?: A Little Help needed climbing 3-5 steps with a railing? : A Little 6 Click Score: 20    End of Session   Activity Tolerance: Patient tolerated treatment well Patient left: in chair;with call bell/phone within reach;with chair alarm set Nurse Communication: Mobility status PT Visit Diagnosis: Other abnormalities of gait and mobility (R26.89);Muscle weakness (generalized) (M62.81);Unsteadiness on feet (R26.81)    Time: 3710-6269 PT Time Calculation (min) (ACUTE ONLY): 25 min   Charges:   PT Evaluation $PT Eval Moderate Complexity: 1 Mod PT Treatments $Gait Training: 8-22 mins   PT G Codes:        2017/05/24  Donnella Sham, PT [phone]  (pager)  Tessie Fass Oneda Duffett May 24, 2017, 2:33 PM

## 2017-04-29 NOTE — Progress Notes (Signed)
Family Medicine Teaching Service Daily Progress Note Intern Pager: [phone]  Patient name: Clayton Richards Medical record number: [id number] Date of birth: Dec 01, 1928 Age: 84 y.o. Gender: male  Primary Care Provider: Chesley Noon, MD Consultants: Cardiology Code Status: DNR  Pt Overview and Major Events to Date:  Clayton Richards is a 84 y.o. male presenting with chest pain, here for ACS rule out. PMH is significant for T2DM, HTN, HLD, myelodysplastic syndrome, anxiety, Sjogren's syndrome, CKD III, hx prostate cancer.   Assessment and Plan:  Chest pain  Also new onset afib overnight. Troponin 0.08 > 0.09 > 0.09 > 0.05. Repeat EKG shows possible ST elevation in anteriolateral and inferior leads. Possible endocardidits?  Patient does not appears volume overloaded, but did have elevated BNP with concern for possible CHF exacerbation. Pt is not currently anticoagulated due to concern for possible GI bleed with positive FOBT. SCD for DVT prophylaxis. CHADVASC 4. HASBLED 2.  -Echo pending -cardiology recommendations appreciated - will discuss with pt about starting anticoagulation  Leukocytosis WBC 12.1. Baseline ~3.  - no overt signs of infection - continue to monitor  Anemia acute on chronic Hemoglobin 7.8 > 8.6 s/p RBC transfusion x1.  Possible GI bleed in setting of myelodysplastic dysplasia. Marland Kitchen Positive FOBT. Baseline hemoglobin, possibly false positive in the setting of iron supplementation. -Start Protonix 40 mg twice daily -continue aspirin in the setting of ACS, consider stopping if becomes profoundly anemic  AKI on CKD2 - possibly due to poor profusion to kidneys.  - continue to monitor  - Strict I/Os  FEN/GI: PPI, HHD PPx: SCD  Disposition: Inpatient hospitalization  Subjective:  Still has chest pain that wraps around lower back. Endorses some dyspnea on deep inspriation. No abdominal pain.   Objective: Temp:  [98 F (36.7 C)-99 F (37.2 C)] 98 F (36.7 C)  (02/18 0443) Pulse Rate:  [55-144] 77 (02/18 0557) Resp:  [16-30] 18 (02/18 0443) BP: (88-146)/(50-96) 98/56 (02/18 0557) SpO2:  [83 %-100 %] 95 % (02/18 0557) Weight:  [132 lb (59.9 kg)-138 lb 3.7 oz (62.7 kg)] 138 lb 3.7 oz (62.7 kg) (02/18 0443) Physical Exam: General: NAD, sitting up in bed Cardiovascular: regular rate, no mrg,  Respiratory: CTAB, normal work of breathing  Abdomen: soft nontender nondistended Extremities: 2+dp, no lower extremity edema  Laboratory: Recent Labs  Lab 04/24/17 1248  04/28/17 1026 04/28/17 1106 04/28/17 1858 04/29/17 0441  WBC 3.4*  --  9.1  --   --  12.1*  HGB  --    < > 8.1* 8.5* 7.8* 8.6*  HCT 25.2*  --  25.8* 25.0* 24.9* 27.4*  PLT 226  --  235  --   --  184   < > = values in this interval not displayed.   Recent Labs  Lab 04/28/17 1026 04/28/17 1106 04/29/17 0441  NA 134* 135 133*  K 4.3 4.2 4.5  CL 103 101 104  CO2 19*  --  17*  BUN 19 20 27*  CREATININE 1.37* 1.30* 1.99*  CALCIUM 8.8*  --  8.1*  PROT 6.8  --   --   BILITOT 1.0  --   --   ALKPHOS 61  --   --   ALT 13*  --   --   AST 21  --   --   GLUCOSE 197* 192* 182*     Bonnita Hollow, MD 04/29/2017, 7:14 AM PGY-1, [place] Intern pager: [phone], text pages welcome

## 2017-04-29 NOTE — Progress Notes (Signed)
  Echocardiogram 2D Echocardiogram has been performed.  Clayton Richards F 04/29/2017, 3:26 PM

## 2017-04-29 NOTE — Progress Notes (Signed)
Initial Nutrition Assessment  DOCUMENTATION CODES:   Not applicable  INTERVENTION:    Glucerna Shake po TID, each supplement provides 220 kcal and 10 grams of protein  NUTRITION DIAGNOSIS:   Inadequate oral intake related to poor appetite as evidenced by 5% weight loss in the past month.  GOAL:   Patient will meet greater than or equal to 90% of their needs  MONITOR:   PO intake, Supplement acceptance  REASON FOR ASSESSMENT:   Consult Assessment of nutrition requirement/status  ASSESSMENT:   84 yo male with PMH of myelodysplastic syndrome, HTN, HLD, anxiety, DM, anemia, neuropathy, depression, prostate CA who was admitted on 2/17 with atypical chest pain, new onset A fib.  Unable to speak with patient at this time. Per review of weight encounters, 5% weight loss within the past month. Labs reviewed. Sodium 133 (L) CBG's: 276-157 Medications reviewed and include ferrous sulfate.  NUTRITION - FOCUSED PHYSICAL EXAM:  Unable to complete NFPE at this time.  Diet Order:  Diet Heart Room service appropriate? Yes; Fluid consistency: Thin  EDUCATION NEEDS:   Education needs have been addressed  Skin:  Skin Assessment: Reviewed RN Assessment  Last BM:  2/15  Height:   Ht Readings from Last 1 Encounters:  04/28/17 5\' 10"  (1.778 m)    Weight:   Wt Readings from Last 1 Encounters:  04/29/17 138 lb 3.7 oz (62.7 kg)    Ideal Body Weight:  75.5 kg  BMI:  Body mass index is 19.83 kg/m.  Estimated Nutritional Needs:   Kcal:  1800-2000  Protein:  85-100 gm  Fluid:  1.8 L    Molli Barrows, RD, LDN, CNSC Pager [phone] After Hours Pager [phone]

## 2017-04-29 NOTE — Discharge Summary (Signed)
[facility] Discharge Summary  Patient name: Clayton Richards Medical record number: [id number] Date of birth: 1928-10-26 Age: 84 y.o. Gender: male Date of Admission: 04/28/2017  Date of Discharge: 05/01/17 Admitting Physician: Alveda Reasons, MD  Primary Care Provider: Chesley Noon, MD Consultants: Cardiology  Indication for Hospitalization: Chest Pain  Discharge Diagnoses/Problem List:  Atypical Chest pain, resolved Leukocytosis Acute on chronic anemia, Possible GI bleed on myelodysplastic disorder AK I on CKD 3 New onset paroxysmal atrial fibrillation HTN DMT2 Sjogren's syndrome Incidental finding of 5 mm right middle lobe ground-glass attenuating nodule  Disposition: Home with [facility] PT/OT  Discharge Condition: Stable  Discharge Exam:  General: NAD, sitting up in bed Cardiovascular: regular rate, no mrg,  Respiratory: CTAB, normal work of breathing  Abdomen: soft nontender nondistended Extremities: 2+dp, no lower extremity edema  Brief Hospital Course:   Chest pain  Atrial fibrillation Clayton Richards is a 84 y.o. male who presented with chest pain.  Full chest pain workup was initiated including EKG which showed J-point elevation and initial i-STAT troponin of 0.15.  Serial troponins were obtained and they were 0.08 > 0.09 > 0.09 > 0.05 consistent with demand ischemia.  Repeat EKG showed diffuse ST elevation in anterior lateral and inferior leads concerning for pericarditis.  ACS was considered not likely given his pleuritic nature of his pain.  CT angios chest abdomen pelvis did not show any evidence of PE or aortic dissection.  Aspirin was given.  Echocardiogram was obtained which showed a new diagnosis of heart failure with EF of 40-45%.  Patient denied any further intensive workup such as cardiac catheterization.  Cardiology recommended medical management.    Atrial fibrillation Patient had new onset atrial fibrillation.  Rate control  started with carvedilol. CHADS-Vasc score of 4. he was felt not to be a candidate for anticoagulation given anemia and possible acute GI bleed.  Patient was not started on anticoagulation in the hospital given the possibility of GI bleed.  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  On admission patient had hemoglobin 7.8.  He was transfused 1 PRBC with posttransfusion hemoglobin 8.6.  Another routine labs showed that he had hemoglobin of 7.7 so patient was transfused another 2 units of RBCs.  Post H&H hemoglobin return to 10.7 and was stable on recheck at 11.4.  Patient had positive FOBT obtained in the ED.  Denies any history of frank bleeding in stool.  Take iron studies, this was thought that it might have caused a false positive on FOBT.  Patient was started on PPI prophylaxis. Continue follow-up as needed with hematologist.  AK I on CKD 3 Patient came in with elevated creatinine at 1.99.  Baseline is around 1.2-1.3.  Patient creatinine did continue to climb to 2.56 status post IV contrast from CT scan.  This did however trend to 2.11.  Patient again is likely due to contrast mediated injury.   Issues for Follow Up:  1. Recheck hemoglobin as outpatient.  Consider rechecking FOBT versus outpatient GI follow-up for possible GI bleeding. 2. Incidental 5 mm lung nodule found on CT chest.  Radiology did not recommend following up on this. 3. Consider enteric-coated aspirin for cardioprotective measures once GI bleed is resolved. 4. Per patient wishes, he said that he desired to start oral anticoagulation with his new onset diagnosis of atrial fibrillation.  He was not started in the hospital given his profound anemia  and possible GI bleed.  Once found to be stable from GI bleed consider further discussion with patient. 5. Patient's Amaryl dose was held due to concern for possible hypoglycemia given his relatively well  "controlled diabetes in his age group. 6. Recheck creatinine to ensure AK I has resolved.  Significant Procedures: None  Significant Labs and Imaging:  Recent Labs  Lab 04/29/17 0441 04/30/17 0342 04/30/17 2304 05/01/17 0437  WBC 12.1* 10.5  --  10.3  HGB 8.6* 7.7* 10.7* 11.4*  HCT 27.4* 24.2* 32.0* 34.0*  PLT 184 182  --  178   Recent Labs  Lab 04/28/17 1026 04/28/17 1106 04/29/17 0441 04/30/17 0342 05/01/17 0437  NA 134* 135 133* 132* 133*  K 4.3 4.2 4.5 4.5 4.0  CL 103 101 104 102 104  CO2 19*  --  17* 15* 13*  GLUCOSE 197* 192* 182* 129* 160*  BUN 19 20 27* 47* 53*  CREATININE 1.37* 1.30* 1.99* 2.56* 2.11*  CALCIUM 8.8*  --  8.1* 8.0* 8.2*  ALKPHOS 61  --   --   --   --   AST 21  --   --   --   --   ALT 13*  --   --   --   --   ALBUMIN 3.0*  --   --   --   --    Transthoracic echocardiogram Study Conclusions, 04/29/2017  - Left ventricle: The cavity size was normal. There was mild   concentric hypertrophy. Systolic function was mildly to   moderately reduced. The estimated ejection fraction was in the   range of 40% to 45%. Wall motion was normal; there were no   regional wall motion abnormalities. Features are consistent with   a pseudonormal left ventricular filling pattern, with concomitant   abnormal relaxation and increased filling pressure (grade 2   diastolic dysfunction). Doppler parameters are consistent with   elevated ventricular end-diastolic filling pressure. - Aortic valve: Transvalvular velocity was minimally increased.   There was no stenosis. - Mitral valve: Calcified annulus. Mildly thickened leaflets .   There was mild regurgitation. - Right ventricle: Systolic function was normal. - Right atrium: The atrium was normal in size. - Tricuspid valve: There was mild regurgitation. - Pulmonary arteries: Systolic pressure was mildly increased. PA   peak pressure: 37 mm Hg (S). - Inferior vena cava: The vessel was normal in size. -  Pericardium, extracardiac: There was no pericardial effusion.  Impressions:  - LVEF is mildly to moderately decreased, there is diffuse   hypokinesis, more pronounced in the basal and mid inferior and   inferolateral walls.  Dg Chest 2 View  Result Date: 04/28/2017 CLINICAL DATA:  Chest pain EXAM: CHEST  2 VIEW COMPARISON:  03/07/2017 FINDINGS: The heart size and mediastinal contours are within normal limits. Aortic atherosclerosis is identified. Both lungs are clear. The visualized skeletal structures are unremarkable. IMPRESSION: No active cardiopulmonary disease. Electronically Signed   By: Kerby Moors M.D.   On: 04/28/2017 11:02   Ct Angio Chest/abd/pel For Dissection W And/or Wo Contrast  Result Date: 04/28/2017 CLINICAL DATA:  Evaluate for aortic dissection. EXAM: CT ANGIOGRAPHY CHEST, ABDOMEN AND PELVIS TECHNIQUE: Multidetector CT imaging through the chest, abdomen and pelvis was performed using the standard protocol during bolus administration of intravenous contrast. Multiplanar reconstructed images and MIPs were obtained and reviewed to evaluate the vascular anatomy. CONTRAST:  192m ISOVUE-370 IOPAMIDOL (ISOVUE-370) INJECTION 76% COMPARISON:  None FINDINGS: CTA CHEST FINDINGS Cardiovascular: The  heart size is normal. There is aortic atherosclerosis. Calcification within the LAD coronary artery identified. No evidence for thoracic aortic dissection or aneurysm. The main pulmonary artery is patent. No central obstructing embolus or saddle embolus identified. Mediastinum/Nodes: No enlarged mediastinal, hilar, or axillary lymph nodes. Thyroid gland, trachea, and esophagus demonstrate no significant findings. Lungs/Pleura: Mild changes of emphysema. No pleural effusions. Dependent changes identified overlying the lung bases. No pneumothorax. Calcified granuloma identified in the right middle lobe. Small ground-glass attenuating nodule in the right middle lobe measures 5 mm. Musculoskeletal:  Spondylosis noted within the thoracic spine. Review of the MIP images confirms the above findings. CTA ABDOMEN AND PELVIS FINDINGS VASCULAR Aorta: Aortic atherosclerosis. No evidence for dissection. No aneurysm. Celiac: Patent. Calcification and narrowing at the origin of the celiac artery is identified, image 97 of series 10. SMA: Patent. Atherosclerotic calcifications and narrowing at the origin of the superior mesenteric artery noted. Renals: Renal arteries are patent. There is atherosclerotic calcification and mild narrowing involving the proximal renal arteries. IMA: Patent without evidence of aneurysm, dissection, vasculitis or significant stenosis. Inflow: Patent without evidence of aneurysm, dissection, vasculitis or significant stenosis. Veins: No obvious venous abnormality within the limitations of this arterial phase study. Review of the MIP images confirms the above findings. NON-VASCULAR Hepatobiliary: No focal liver abnormality is seen. Status post cholecystectomy. No biliary dilatation. Pancreas: Unremarkable. No pancreatic ductal dilatation or surrounding inflammatory changes. Spleen: Normal in size without focal abnormality. Adrenals/Urinary Tract: Normal adrenal glands. Mild bilateral renal cortical thinning identified. Lower pole cyst within the left kidney is identified measuring approximately 1.4 cm. No mass or hydronephrosis. The urinary bladder appears normal. Stomach/Bowel: The stomach is normal. The small bowel loops have a normal course and caliber. There is no pathologic dilatation of the colon. Numerous colonic diverticula noted without acute inflammation. Lymphatic: No enlarged upper abdominal lymph nodes. No pelvic or inguinal adenopathy. Reproductive: Seed implants are identified within the prostate gland. Other: There is no ascites or focal fluid collections within the abdomen or pelvis. Musculoskeletal: There is spondylosis noted throughout the lumbar spine. No suspicious bone  lesions. Review of the MIP images confirms the above findings. IMPRESSION: 1. No evidence for aortic dissection. 2. Aortic atherosclerosis with atherosclerotic calcifications in the LAD coronary artery. Atherosclerotic calcifications with narrowing involving the origins of the celiac artery, SMA and renal arteries. 3. Right middle lobe ground-glass attenuating nodule. This measures 5 mm. No follow-up recommended. This recommendation follows the consensus statement: Guidelines for Management of Incidental Pulmonary Nodules Detected on CT Images: From the Fleischner Society 2017; Radiology 2017; 284:228-243. Electronically Signed   By: Kerby Moors M.D.   On: 04/28/2017 12:03    Results/Tests Pending at Time of Discharge: None  Discharge Medications:  Allergies as of 05/01/2017      Reactions   Contrast Media [iodinated Diagnostic Agents] Other (See Comments)   Developed AKI 24-48 hrs after contrast      Medication List    STOP taking these medications   aspirin 81 MG tablet   glimepiride 4 MG tablet Commonly known as:  AMARYL     TAKE these medications   ALIGN PO Take 1 tablet by mouth daily.   ALKA-SELTZER HEARTBURN PO Take 1 tablet by mouth as needed (indigestion).   amLODipine-atorvastatin 5-20 MG tablet Commonly known as:  CADUET Take 1 tablet by mouth daily.   buPROPion 300 MG 24 hr tablet Commonly known as:  WELLBUTRIN XL Take 300 mg by mouth daily.   calcium  carbonate 600 MG Tabs tablet Commonly known as:  OS-CAL Take 600 mg by mouth 2 (two) times daily with a meal.   carvedilol 6.25 MG tablet Commonly known as:  COREG Take 1 tablet (6.25 mg total) by mouth 2 (two) times daily with a meal.   ferrous sulfate 325 (65 FE) MG tablet Take 325 mg by mouth 3 (three) times daily with meals.   metFORMIN 1000 MG tablet Commonly known as:  GLUCOPHAGE Take 1,000 mg by mouth 2 (two) times daily with a meal.   OMEGA-3 FISH OIL PO Take by mouth.   sitaGLIPtin 50 MG  tablet Commonly known as:  JANUVIA Take 50 mg by mouth daily.   valsartan-hydrochlorothiazide 320-12.5 MG tablet Commonly known as:  DIOVAN-HCT Take 1 tablet by mouth daily.   VITAMIN B 12 PO Take 5,000 mcg by mouth daily.   Vitamin D 2000 units Caps Take 2,000 Units by mouth daily.            Durable Medical Equipment  (From admission, onward)        Start     Ordered   05/01/17 1432  For home use only DME lightweight manual wheelchair with seat cushion  (Wheelchairs)  Once    Comments:  Patient suffers from congestive heart failure and profound anemia from myelodysplastic disorder which impairs their ability to perform daily activities like bathing, dressing, feeding, grooming and toileting in the home.  A cane, crutch or walker will not resolve  issue with performing activities of daily living. A wheelchair will allow patient to safely perform daily activities. Patient is not able to propel themselves in the home using a standard weight wheelchair due to endurance and general weakness. Patient can self propel in the lightweight wheelchair.  Accessories: elevating leg rests (ELRs), wheel locks, extensions and anti-tippers.   05/01/17 1437      Discharge Instructions: Please refer to Patient Instructions section of EMR for full details.  Patient was counseled important signs and symptoms that should prompt return to medical care, changes in medications, dietary instructions, activity restrictions, and follow up appointments.   Follow-Up Appointments: [facility] Follow up.   Why:  Magazine features editor information: [address] [place] [address] [place] Follow up.   Specialty:  Home Health Services Why:  Physical Therapy Contact information: [place] [address] [phone]        Chesley Noon, MD. Schedule an appointment as soon as  possible for a visit in 1 week(s).   Specialty:  Family Medicine Contact information: [place] [address] [phone]           Bonnita Hollow, MD 05/01/2017, 2:42 PM PGY-1, [place]

## 2017-04-30 DIAGNOSIS — I429 Cardiomyopathy, unspecified: Secondary | ICD-10-CM

## 2017-04-30 DIAGNOSIS — R079 Chest pain, unspecified: Secondary | ICD-10-CM

## 2017-04-30 DIAGNOSIS — R748 Abnormal levels of other serum enzymes: Secondary | ICD-10-CM

## 2017-04-30 LAB — GLUCOSE, CAPILLARY
GLUCOSE-CAPILLARY: 113 mg/dL — AB (ref 65–99)
GLUCOSE-CAPILLARY: 151 mg/dL — AB (ref 65–99)
GLUCOSE-CAPILLARY: 155 mg/dL — AB (ref 65–99)
GLUCOSE-CAPILLARY: 157 mg/dL — AB (ref 65–99)

## 2017-04-30 LAB — URINALYSIS, ROUTINE W REFLEX MICROSCOPIC
Bilirubin Urine: NEGATIVE
GLUCOSE, UA: NEGATIVE mg/dL
HGB URINE DIPSTICK: NEGATIVE
Ketones, ur: NEGATIVE mg/dL
LEUKOCYTES UA: NEGATIVE
NITRITE: NEGATIVE
PROTEIN: 30 mg/dL — AB
SPECIFIC GRAVITY, URINE: 1.026 (ref 1.005–1.030)
pH: 5 (ref 5.0–8.0)

## 2017-04-30 LAB — BASIC METABOLIC PANEL
ANION GAP: 15 (ref 5–15)
BUN: 47 mg/dL — ABNORMAL HIGH (ref 6–20)
CHLORIDE: 102 mmol/L (ref 101–111)
CO2: 15 mmol/L — ABNORMAL LOW (ref 22–32)
Calcium: 8 mg/dL — ABNORMAL LOW (ref 8.9–10.3)
Creatinine, Ser: 2.56 mg/dL — ABNORMAL HIGH (ref 0.61–1.24)
GFR, EST AFRICAN AMERICAN: 24 mL/min — AB (ref >60)
GFR, EST NON AFRICAN AMERICAN: 21 mL/min — AB (ref >60)
Glucose, Bld: 129 mg/dL — ABNORMAL HIGH (ref 65–99)
POTASSIUM: 4.5 mmol/L (ref 3.5–5.1)
SODIUM: 132 mmol/L — AB (ref 135–145)

## 2017-04-30 LAB — CBC WITH DIFFERENTIAL/PLATELET
BASOS ABS: 0 10*3/uL (ref 0.0–0.1)
BASOS PCT: 0 %
EOS ABS: 0 10*3/uL (ref 0.0–0.7)
Eosinophils Relative: 0 %
HCT: 24.2 % — ABNORMAL LOW (ref 39.0–52.0)
HEMOGLOBIN: 7.7 g/dL — AB (ref 13.0–17.0)
Lymphocytes Relative: 8 %
Lymphs Abs: 0.8 10*3/uL (ref 0.7–4.0)
MCH: 29.2 pg (ref 26.0–34.0)
MCHC: 31.8 g/dL (ref 30.0–36.0)
MCV: 91.7 fL (ref 78.0–100.0)
MONO ABS: 3.3 10*3/uL — AB (ref 0.1–1.0)
Monocytes Relative: 31 %
NEUTROS PCT: 61 %
Neutro Abs: 6.4 10*3/uL (ref 1.7–7.7)
PLATELETS: 182 10*3/uL (ref 150–400)
RBC: 2.64 MIL/uL — AB (ref 4.22–5.81)
RDW: 16.7 % — ABNORMAL HIGH (ref 11.5–15.5)
WBC: 10.5 10*3/uL (ref 4.0–10.5)

## 2017-04-30 LAB — PREPARE RBC (CROSSMATCH)

## 2017-04-30 MED ORDER — SODIUM CHLORIDE 0.9 % IV SOLN
Freq: Once | INTRAVENOUS | Status: AC
Start: 2017-04-30 — End: 2017-04-30
  Administered 2017-04-30: 10:00:00 via INTRAVENOUS

## 2017-04-30 NOTE — Progress Notes (Signed)
Physical Therapy Treatment Patient Details Name: Clayton Richards MRN: [id number] DOB: 09-Apr-1928 Today's Date: 04/30/2017    History of Present Illness Pt is an 84 y.o. male who was admitted with chest pain. He has a PMH significant for anemia, anxiety, depression, diabetes mellitus without complication, hyperlipidemia, hypertension, neuropathy, nyelodysplastic syndrome, anxiety, Sjogren's syndrome, CKD III, and prostate cancer.     PT Comments    Pt needed extra encouragement today.  Pt presently getting blood due to low Hgb and pt not feeling like he had the energy to get OOB today.   Once up, gait was steady with RW, though activity was minimal.  Follow Up Recommendations  Home health PT     Equipment Recommendations  None recommended by PT    Recommendations for Other Services       Precautions / Restrictions Precautions Precautions: Fall    Mobility  Bed Mobility Overal bed mobility: Needs Assistance Bed Mobility: Supine to Sit;Sit to Supine     Supine to sit: Supervision Sit to supine: Supervision   General bed mobility comments: pt needed extra time, but got in/out and repositioned himself without assist  Transfers Overall transfer level: Needs assistance Equipment used: Rolling walker (2 wheeled) Transfers: Sit to/from Stand Sit to Stand: Supervision;From elevated surface         General transfer comment: cues for hand placment  Ambulation/Gait Ambulation/Gait assistance: Min guard Ambulation Distance (Feet): 30 Feet Assistive device: Rolling walker (2 wheeled) Gait Pattern/deviations: Step-through pattern   Gait velocity interpretation: Below normal speed for age/gender General Gait Details: steppage gait R LE > than L LE.  pt reported he was too fatigued to go further and asked to return to bed.   Stairs            Wheelchair Mobility    Modified Rankin (Stroke Patients Only)       Balance     Sitting balance-Leahy Scale: Good       Standing balance-Leahy Scale: Poor Standing balance comment: relies on UE support                            Cognition Arousal/Alertness: Awake/alert Behavior During Therapy: WFL for tasks assessed/performed Overall Cognitive Status: Within Functional Limits for tasks assessed                                        Exercises      General Comments        Pertinent Vitals/Pain      Home Living                      Prior Function            PT Goals (current goals can now be found in the care plan section) Acute Rehab PT Goals Patient Stated Goal: feel less tired PT Goal Formulation: With patient Time For Goal Achievement: 05/06/17 Potential to Achieve Goals: Fair Progress towards PT goals: Progressing toward goals    Frequency    Min 3X/week      PT Plan Current plan remains appropriate    Co-evaluation              AM-PAC PT "6 Clicks" Daily Activity  Outcome Measure  Difficulty turning over in bed (including adjusting bedclothes, sheets and blankets)?: None Difficulty moving from lying  on back to sitting on the side of the bed? : None Difficulty sitting down on and standing up from a chair with arms (e.g., wheelchair, bedside commode, etc,.)?: A Little Help needed moving to and from a bed to chair (including a wheelchair)?: A Little Help needed walking in hospital room?: A Little Help needed climbing 3-5 steps with a railing? : A Little 6 Click Score: 20    End of Session   Activity Tolerance: Patient tolerated treatment well;Patient limited by fatigue Patient left: in bed;with call bell/phone within reach;with family/visitor present Nurse Communication: Mobility status PT Visit Diagnosis: Other abnormalities of gait and mobility (R26.89);Muscle weakness (generalized) (M62.81);Unsteadiness on feet (R26.81)     Time: 8891-6945 PT Time Calculation (min) (ACUTE ONLY): 12 min  Charges:  $Gait Training:  8-22 mins                    G Codes:       05-09-2017  Donnella Sham, PT [phone]  (pager)   Tessie Fass Dekota Kirlin 05-09-2017, 5:10 PM

## 2017-04-30 NOTE — Progress Notes (Signed)
Family Medicine Teaching Service Daily Progress Note Intern Pager: [phone]  Patient name: Clayton Richards Medical record number: [id number] Date of birth: September 16, 1928 Age: 84 y.o. Gender: male  Primary Care Provider: Chesley Noon, MD Consultants: Cardiology Code Status: DNR  Pt Overview and Major Events to Date:  Clayton Richards is a 84 y.o. male presenting with chest pain, here for ACS rule out. PMH is significant for T2DM, HTN, HLD, myelodysplastic syndrome, anxiety, Sjogren's syndrome, CKD III, hx prostate cancer.   Assessment and Plan:  GI bleed Positive FOBT.  Patient's hemoglobin dropped from 8.6 > 7.7.  Diffusion threshold 8. Will give 2 units RBC transfusion given likelihood of ischemic changes leading to new onset A. fib and heart failure.  He is hemodynamically stable and saturating well on room air.  His chest pain is actually resolved and endorses improved shortness of breath.  I do not believe his symptoms are related to his anemia.   -Transfuse 2 units RBC -Post transfusion H&H,  -Patient presented wanting to leave today, consider outpatient GI colonoscopy and outpatient CBC  Paroxsymal atrial fibrillation Rate controlled with coreg. CHADVASC 4. HASBLED 2.  Patient has active GI bleed.  We will not start anticoagulants in this hospitalization given active GI bleed. Patient endorses wanting to start anticoagulation, please see prior progress note for more thorough discussion.   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  Already on Coreg. Pt does not appear volume overloaded on exam. We will not initiate ACE inhibitor in the setting of worsening renal function. -Consider starting ACE inhibitor up in outpatient setting -daily weights  Chest pain, resolved Possibly demand ischemia in the setting of new onset A fib vs new dx of HPmrEF. Unsure of the pleuritic nature of difficulty breathing,  but this too has improved.  -cardiology recommendations medical management.  Patient denies wanting Interventional catheterization  Leukocytosis, improved WBC 12.1> 10.5 improved. Baseline ~3. no overt signs of infection.   AKI on CKD2-worsening -1.99>2.56, possibly due to poor profusion to kidneys.  -Hold initiating ACE inhibitor due to AK I - continue to monitor  - Strict I/Os  PT/OT Recommended HHPT/OT 24 supervision. Pt has 24 hr supervision at home.   FEN/GI: PPI, HHD PPx: SCD  Disposition: Inpatient hospitalization due to anemia Subjective:  Chest pain has improved.  Shortness of breath has nearly resolved.  Still has minor pleuritic difficulty breathing.  Objective: Temp:  [98.1 F (36.7 C)-99.2 F (37.3 C)] 99.2 F (37.3 C) (02/19 0438) Pulse Rate:  [69-102] 71 (02/19 0438) Resp:  [16-20] 20 (02/19 0438) BP: (88-116)/(40-67) 106/55 (02/19 0438) SpO2:  [93 %-95 %] 93 % (02/19 0438) Weight:  [140 lb 3.4 oz (63.6 kg)] 140 lb 3.4 oz (63.6 kg) (02/19 0438) Physical Exam: General: NAD, sitting up in bed Cardiovascular: regular rate, no mrg,  Respiratory: CTAB, normal work of breathing  Rectal: No frank blood on exam, no hemorrhoids or anal fissures, no enlarged prostate or prostate tenderness Abdomen: soft nontender nondistended Extremities: 2+dp, no lower extremity edema  Laboratory: Recent Labs  Lab 04/28/17 1026  04/28/17 1858 04/29/17 0441 04/30/17 0342  WBC 9.1  --   --  12.1* 10.5  HGB 8.1*   < > 7.8* 8.6* 7.7*  HCT 25.8*   < > 24.9* 27.4* 24.2*  PLT 235  --   --  184 182   < > = values in this interval not  displayed.   Recent Labs  Lab 04/28/17 1026 04/28/17 1106 04/29/17 0441 04/30/17 0342  NA 134* 135 133* 132*  K 4.3 4.2 4.5 4.5  CL 103 101 104 102  CO2 19*  --  17* 15*  BUN 19 20 27* 47*  CREATININE 1.37* 1.30* 1.99* 2.56*  CALCIUM 8.8*  --  8.1* 8.0*  PROT 6.8  --   --   --   BILITOT 1.0  --   --   --   ALKPHOS 61  --   --   --   ALT  13*  --   --   --   AST 21  --   --   --   GLUCOSE 197* 192* 182* 129*   Bonnita Hollow, MD 04/30/2017, 6:43 AM PGY-1, [place] Intern pager: [phone], text pages welcome

## 2017-04-30 NOTE — Progress Notes (Signed)
Progress Note  Patient Name: Clayton Richards Date of Encounter: 04/30/2017  Primary Cardiologist: No primary care provider on file.  Subjective   No chest pain or shortness of breath. Really no complaints this morning. H/H dropped and planned for transfusion.   Inpatient Medications    Scheduled Meds: . buPROPion  300 mg Oral Daily  . carvedilol  3.125 mg Oral BID WC  . feeding supplement (GLUCERNA SHAKE)  237 mL Oral TID BM  . ferrous sulfate  325 mg Oral TID WC  . insulin aspart  0-5 Units Subcutaneous QHS  . insulin aspart  0-9 Units Subcutaneous TID WC  . pantoprazole  40 mg Oral BID   Continuous Infusions: . sodium chloride     PRN Meds: acetaminophen, ondansetron (ZOFRAN) IV   Vital Signs    Vitals:   04/29/17 2024 04/30/17 0438 04/30/17 0831 04/30/17 0956  BP: (!) 97/41 (!) 106/55 109/61 (!) 106/49  Pulse: 69 71  92  Resp: 16 20  18   Temp: 98.8 F (37.1 C) 99.2 F (37.3 C)  99.2 F (37.3 C)  TempSrc: Oral Oral  Oral  SpO2: 95% 93%  94%  Weight:  140 lb 3.4 oz (63.6 kg)    Height:        Intake/Output Summary (Last 24 hours) at 04/30/2017 1010 Last data filed at 04/30/2017 0850 Gross per 24 hour  Intake 150 ml  Output -  Net 150 ml   Filed Weights   04/28/17 2027 04/29/17 0443 04/30/17 0438  Weight: 132 lb 0.9 oz (59.9 kg) 138 lb 3.7 oz (62.7 kg) 140 lb 3.4 oz (63.6 kg)    Telemetry    Afib (rates 120s) - Personally Reviewed  ECG    N/a - Personally Reviewed  Physical Exam   General: Thin pale older W male appearing in no acute distress. Head: Normocephalic, atraumatic.  Neck: Supple without bruits, JVD. Lungs:  Resp regular and unlabored, CTA. Heart: Tachy, Irreg, S1, S2, no S3, S4, or murmur; no rub. Abdomen: Soft, non-tender, non-distended with normoactive bowel sounds. No hepatomegaly. No rebound/guarding. No obvious abdominal masses. Extremities: No clubbing, cyanosis, edema. Distal pedal pulses are 2+ bilaterally. Neuro: Alert  and oriented X 3. Moves all extremities spontaneously. Psych: Normal affect.  Labs    Chemistry Recent Labs  Lab 04/28/17 1026 04/28/17 1106 04/29/17 0441 04/30/17 0342  NA 134* 135 133* 132*  K 4.3 4.2 4.5 4.5  CL 103 101 104 102  CO2 19*  --  17* 15*  GLUCOSE 197* 192* 182* 129*  BUN 19 20 27* 47*  CREATININE 1.37* 1.30* 1.99* 2.56*  CALCIUM 8.8*  --  8.1* 8.0*  PROT 6.8  --   --   --   ALBUMIN 3.0*  --   --   --   AST 21  --   --   --   ALT 13*  --   --   --   ALKPHOS 61  --   --   --   BILITOT 1.0  --   --   --   GFRNONAA 45*  --  28* 21*  GFRAA 52*  --  33* 24*  ANIONGAP 12  --  12 15     Hematology Recent Labs  Lab 04/28/17 1026  04/28/17 1858 04/29/17 0441 04/30/17 0342  WBC 9.1  --   --  12.1* 10.5  RBC 2.75*  --   --  2.95* 2.64*  HGB 8.1*   < >  7.8* 8.6* 7.7*  HCT 25.8*   < > 24.9* 27.4* 24.2*  MCV 93.8  --   --  92.9 91.7  MCH 29.5  --   --  29.2 29.2  MCHC 31.4  --   --  31.4 31.8  RDW 17.2*  --   --  16.5* 16.7*  PLT 235  --   --  184 182   < > = values in this interval not displayed.    Cardiac Enzymes Recent Labs  Lab 04/28/17 1602 04/28/17 1858 04/28/17 2056 04/29/17 0754  TROPONINI 0.08* 0.09* 0.09* 0.05*    Recent Labs  Lab 04/28/17 1035  TROPIPOC 0.15*     BNP Recent Labs  Lab 04/28/17 1026  BNP 509.7*     DDimer No results for input(s): DDIMER in the last 168 hours.    Radiology    Dg Chest 2 View  Result Date: 04/28/2017 CLINICAL DATA:  Chest pain EXAM: CHEST  2 VIEW COMPARISON:  03/07/2017 FINDINGS: The heart size and mediastinal contours are within normal limits. Aortic atherosclerosis is identified. Both lungs are clear. The visualized skeletal structures are unremarkable. IMPRESSION: No active cardiopulmonary disease. Electronically Signed   By: Kerby Moors M.D.   On: 04/28/2017 11:02   Ct Angio Chest/abd/pel For Dissection W And/or Wo Contrast  Result Date: 04/28/2017 CLINICAL DATA:  Evaluate for aortic  dissection. EXAM: CT ANGIOGRAPHY CHEST, ABDOMEN AND PELVIS TECHNIQUE: Multidetector CT imaging through the chest, abdomen and pelvis was performed using the standard protocol during bolus administration of intravenous contrast. Multiplanar reconstructed images and MIPs were obtained and reviewed to evaluate the vascular anatomy. CONTRAST:  144mL ISOVUE-370 IOPAMIDOL (ISOVUE-370) INJECTION 76% COMPARISON:  None FINDINGS: CTA CHEST FINDINGS Cardiovascular: The heart size is normal. There is aortic atherosclerosis. Calcification within the LAD coronary artery identified. No evidence for thoracic aortic dissection or aneurysm. The main pulmonary artery is patent. No central obstructing embolus or saddle embolus identified. Mediastinum/Nodes: No enlarged mediastinal, hilar, or axillary lymph nodes. Thyroid gland, trachea, and esophagus demonstrate no significant findings. Lungs/Pleura: Mild changes of emphysema. No pleural effusions. Dependent changes identified overlying the lung bases. No pneumothorax. Calcified granuloma identified in the right middle lobe. Small ground-glass attenuating nodule in the right middle lobe measures 5 mm. Musculoskeletal: Spondylosis noted within the thoracic spine. Review of the MIP images confirms the above findings. CTA ABDOMEN AND PELVIS FINDINGS VASCULAR Aorta: Aortic atherosclerosis. No evidence for dissection. No aneurysm. Celiac: Patent. Calcification and narrowing at the origin of the celiac artery is identified, image 97 of series 10. SMA: Patent. Atherosclerotic calcifications and narrowing at the origin of the superior mesenteric artery noted. Renals: Renal arteries are patent. There is atherosclerotic calcification and mild narrowing involving the proximal renal arteries. IMA: Patent without evidence of aneurysm, dissection, vasculitis or significant stenosis. Inflow: Patent without evidence of aneurysm, dissection, vasculitis or significant stenosis. Veins: No obvious venous  abnormality within the limitations of this arterial phase study. Review of the MIP images confirms the above findings. NON-VASCULAR Hepatobiliary: No focal liver abnormality is seen. Status post cholecystectomy. No biliary dilatation. Pancreas: Unremarkable. No pancreatic ductal dilatation or surrounding inflammatory changes. Spleen: Normal in size without focal abnormality. Adrenals/Urinary Tract: Normal adrenal glands. Mild bilateral renal cortical thinning identified. Lower pole cyst within the left kidney is identified measuring approximately 1.4 cm. No mass or hydronephrosis. The urinary bladder appears normal. Stomach/Bowel: The stomach is normal. The small bowel loops have a normal course and caliber. There is no pathologic  dilatation of the colon. Numerous colonic diverticula noted without acute inflammation. Lymphatic: No enlarged upper abdominal lymph nodes. No pelvic or inguinal adenopathy. Reproductive: Seed implants are identified within the prostate gland. Other: There is no ascites or focal fluid collections within the abdomen or pelvis. Musculoskeletal: There is spondylosis noted throughout the lumbar spine. No suspicious bone lesions. Review of the MIP images confirms the above findings. IMPRESSION: 1. No evidence for aortic dissection. 2. Aortic atherosclerosis with atherosclerotic calcifications in the LAD coronary artery. Atherosclerotic calcifications with narrowing involving the origins of the celiac artery, SMA and renal arteries. 3. Right middle lobe ground-glass attenuating nodule. This measures 5 mm. No follow-up recommended. This recommendation follows the consensus statement: Guidelines for Management of Incidental Pulmonary Nodules Detected on CT Images: From the Fleischner Society 2017; Radiology 2017; 284:228-243. Electronically Signed   By: Kerby Moors M.D.   On: 04/28/2017 12:03    Cardiac Studies   TTE: 04/29/17  Study Conclusions  - Left ventricle: The cavity size was  normal. There was mild   concentric hypertrophy. Systolic function was mildly to   moderately reduced. The estimated ejection fraction was in the   range of 40% to 45%. Wall motion was normal; there were no   regional wall motion abnormalities. Features are consistent with   a pseudonormal left ventricular filling pattern, with concomitant   abnormal relaxation and increased filling pressure (grade 2   diastolic dysfunction). Doppler parameters are consistent with   elevated ventricular end-diastolic filling pressure. - Aortic valve: Transvalvular velocity was minimally increased.   There was no stenosis. - Mitral valve: Calcified annulus. Mildly thickened leaflets .   There was mild regurgitation. - Right ventricle: Systolic function was normal. - Right atrium: The atrium was normal in size. - Tricuspid valve: There was mild regurgitation. - Pulmonary arteries: Systolic pressure was mildly increased. PA   peak pressure: 37 mm Hg (S). - Inferior vena cava: The vessel was normal in size. - Pericardium, extracardiac: There was no pericardial effusion.  Impressions:  - LVEF is mildly to moderately decreased, there is diffuse   hypokinesis, more pronounced in the basal and mid inferior and   inferolateral walls.   Patient Profile     84 y.o. male with a hx of myelodysplastic syndrome, HTN, DM, HLD, CKD stage III who was seen for the evaluation of chest pain and abnormal EKG.  Assessment & Plan    1. Chest pain: EKG with diffuse ST elevation yesterday. No further episodes of chest pain. He certainly can have CAD but not a candidate for invasive evaluation. He is DNR and not interested any evaluation. Echo showed EF of 40-45% with diffuse hypokinesis in the basal/inferolateral walls. No pericardial effusion. At this time would continue to treat medically, no plans for invasive work up.   2. New onset paroxysmal atrial fibrillation: Back in Afib today with elevated rate in the  setting of worsening anemia. CHADSVASC score of 4. [place] on hold 2/2 to GI bleed.  3. HTN - on BB  4. DM - per primary team  5. Acute anemia with MDS and + FOBT - s/p transfused 1 unit of PRBCs with initial improvement. H/H 7.7/24.2 today. Transfusing 2 units PRBCs per primary team  6. Acute on CKD stage III - Scr of 1.99>>2.56. Baseline around 1.2-1.3. Avoid nephrotoxic agent.   Signed, Reino Bellis, NP  04/30/2017, 10:10 AM  Pager # [phone]   For questions or updates, please contact [place] Please consult [email or web address]  for contact info under Cardiology/STEMI.   Patient seen and examined. Agree with assessment and plan.   No recurrent chest pain. .  EF 40-45% on echo with grade 2 diastolic dysfunction with diffuse hypo-contractility, perhaps slightly more pronounced in the basal to mid inferior inferolateral wall. .  Creatinine is increased to 2.56. H/H decreased to 7.7/24.2; current getting 1 unit packed red blood cell transfusion.  Rhythm shows atrial fibrillation.  With GI bleeding, not a candidate for anticoagulation presently.  Will increase carvedilol to 6.25 mg twice a day. ? Enteric coated ASA 81 mg if ok by GI.  Conservative therapy for possible CAD, without invasive strategy.  Will repeat ECG in a.m.   Troy Sine, MD, [facility] 04/30/2017 11:20 AM

## 2017-05-01 ENCOUNTER — Other Ambulatory Visit: Payer: Medicare Other

## 2017-05-01 DIAGNOSIS — I481 Persistent atrial fibrillation: Secondary | ICD-10-CM

## 2017-05-01 LAB — BPAM RBC
BLOOD PRODUCT EXPIRATION DATE: 201902252359
BLOOD PRODUCT EXPIRATION DATE: 201903062359
Blood Product Expiration Date: 201903062359
ISSUE DATE / TIME: 201902172317
ISSUE DATE / TIME: 201902190951
ISSUE DATE / TIME: 201902191413
Unit Type and Rh: 600
Unit Type and Rh: 6200
Unit Type and Rh: 6200

## 2017-05-01 LAB — GLUCOSE, CAPILLARY
GLUCOSE-CAPILLARY: 160 mg/dL — AB (ref 65–99)
GLUCOSE-CAPILLARY: 175 mg/dL — AB (ref 65–99)
Glucose-Capillary: 163 mg/dL — ABNORMAL HIGH (ref 65–99)

## 2017-05-01 LAB — CBC
HCT: 34 % — ABNORMAL LOW (ref 39.0–52.0)
Hemoglobin: 11.4 g/dL — ABNORMAL LOW (ref 13.0–17.0)
MCH: 29.8 pg (ref 26.0–34.0)
MCHC: 33.5 g/dL (ref 30.0–36.0)
MCV: 89 fL (ref 78.0–100.0)
PLATELETS: 178 10*3/uL (ref 150–400)
RBC: 3.82 MIL/uL — ABNORMAL LOW (ref 4.22–5.81)
RDW: 16.6 % — AB (ref 11.5–15.5)
WBC: 10.3 10*3/uL (ref 4.0–10.5)

## 2017-05-01 LAB — BCR ABL1 FISH (GENPATH)

## 2017-05-01 LAB — TYPE AND SCREEN
ABO/RH(D): A POS
ANTIBODY SCREEN: NEGATIVE
UNIT DIVISION: 0
Unit division: 0
Unit division: 0

## 2017-05-01 LAB — BASIC METABOLIC PANEL
ANION GAP: 16 — AB (ref 5–15)
BUN: 53 mg/dL — ABNORMAL HIGH (ref 6–20)
CALCIUM: 8.2 mg/dL — AB (ref 8.9–10.3)
CO2: 13 mmol/L — ABNORMAL LOW (ref 22–32)
Chloride: 104 mmol/L (ref 101–111)
Creatinine, Ser: 2.11 mg/dL — ABNORMAL HIGH (ref 0.61–1.24)
GFR, EST AFRICAN AMERICAN: 31 mL/min — AB (ref >60)
GFR, EST NON AFRICAN AMERICAN: 27 mL/min — AB (ref >60)
Glucose, Bld: 160 mg/dL — ABNORMAL HIGH (ref 65–99)
Potassium: 4 mmol/L (ref 3.5–5.1)
SODIUM: 133 mmol/L — AB (ref 135–145)

## 2017-05-01 LAB — HEMOGLOBIN AND HEMATOCRIT, BLOOD
HEMATOCRIT: 32 % — AB (ref 39.0–52.0)
HEMOGLOBIN: 10.7 g/dL — AB (ref 13.0–17.0)

## 2017-05-01 MED ORDER — CARVEDILOL 6.25 MG PO TABS: 6.2500 mg | ORAL_TABLET | Freq: Two times a day (BID) | ORAL | 0 refills | Status: AC

## 2017-05-01 MED ORDER — CARVEDILOL 6.25 MG PO TABS
6.2500 mg | ORAL_TABLET | Freq: Two times a day (BID) | ORAL | Status: DC
  Administered 2017-05-01 (×2): 6.25 mg via ORAL
  Filled 2017-05-01 (×2): qty 1

## 2017-05-01 NOTE — Progress Notes (Signed)
    Durable Medical Equipment  (From admission, onward)        Start     Ordered   05/01/17 1432  For home use only DME lightweight manual wheelchair with seat cushion  (Wheelchairs)  Once    Comments:  Patient suffers from congestive heart failure and profound anemia from myelodysplastic disorder which impairs their ability to perform daily activities like bathing, dressing, feeding, grooming and toileting in the home.  A cane, crutch or walker will not resolve  issue with performing activities of daily living. A wheelchair will allow patient to safely perform daily activities. Patient is not able to propel themselves in the home using a standard weight wheelchair due to endurance and general weakness. Patient can self propel in the lightweight wheelchair.  Accessories: elevating leg rests (ELRs), wheel locks, extensions and anti-tippers.   05/01/17 1437

## 2017-05-01 NOTE — Progress Notes (Signed)
D/ced pt to home with wheelchair. Removed IVs and reviewed paperwork. No further questions.

## 2017-05-01 NOTE — Discharge Instructions (Signed)
You were admitted to the hospital initially for chest pain.  A full workup of your heart showed that you have new onset atrial fibrillation and a new diagnosis of congestive heart failure.  You were started on a beta-blocker call her Coreg to help control your heart rate.  We discussed the risk and benefits of oral anticoagulation including stroke prevention and the risk of bleeding.  Given that there was a possibility that you were actively bleeding in the hospital, we did not start anticoagulation. You can follow-up with regular doctor to discuss this further.   Your diabetes is well controlled for your age group.  Please stop taking your Amaryl as this can cause low blood sugar which is dangerous.  While in the hospital you were also anemic.  You received a total of 3 blood transfusions to bring her hemoglobin levels above your baseline.  You did have a positive test for possible intestinal bleeding, however, we are unsure if this test was truly accurate given how quickly your hemoglobin levels returned after your transfusion.  More likely, it was that your hemoglobin was already low due to your myeloblastic disorder.  Please follow-up with your regular doctor to ensure that your blood levels remain stable.     Please take your medications as prescribed on discharge.  If you develop any worsening chest pain, shortness of breath, palpitations, lightheadedness, please call your regular doctor or go to the emergency room as this may be a consequence of further cardiac problems.

## 2017-05-01 NOTE — Care Management Note (Addendum)
Case Management Note  Patient Details  Name: Clayton Richards MRN: [id number] Date of Birth: 1928/08/02  Subjective/Objective: Pt presented for Chest Pain- plan for medical management. Pt is A DNR-PTA from home with wife. Pt has DME RW at home and wants a wheelchair due to difficulty getting out of the car 2/2 weakness.                    Action/Plan: CM did speak with MD in regards to orders for DME WC and to sign the narrative progress note. MD will write order for [facility] RN in order for CM to fax to [facility] for palliative services. MD to place orders for PT/OT. Referral given to [facility] with [facility] for DME and Skilled Services. SOC to begin within 24-48 hours post d/c. No further needs from CM at this time.    Expected Discharge Date:                  Expected Discharge Plan:  [place]  In-House Referral:  NA  Discharge planning Services  CM Consult  Post Acute Care Choice:  Home Health, Durable Medical Equipment Choice offered to:  Patient, Spouse  DME Arranged:  Wheelchair manual DME Agency:  [place]:  PT [place]:  [place]  Status of Service:  Completed, signed off  If discussed at [place] of Stay Meetings, dates discussed:    Additional Comments: 1352 05-01-17 Jacqlyn Krauss, RN,BSN [phone] CM did make [facility] aware to contact the PCP-for the palliative care services to be started at home. No further needs from CM at this time    1327 05-01-17 Jacqlyn Krauss, RN,BSN [phone] Unfortunately the patient will have to go through the PCP- for Palliative Care Services to be set up. CM will make pt aware.  Bethena Roys, RN 05/01/2017, 11:59 AM

## 2017-05-01 NOTE — Progress Notes (Signed)
Progress Note  Patient Name: Clayton Richards Date of Encounter: 05/01/2017  Primary Cardiologist: No primary care provider on file.  Subjective   No complaints this morning.   Inpatient Medications    Scheduled Meds: . buPROPion  300 mg Oral Daily  . carvedilol  6.25 mg Oral BID WC  . feeding supplement (GLUCERNA SHAKE)  237 mL Oral TID BM  . ferrous sulfate  325 mg Oral TID WC  . insulin aspart  0-5 Units Subcutaneous QHS  . insulin aspart  0-9 Units Subcutaneous TID WC  . pantoprazole  40 mg Oral BID   Continuous Infusions:  PRN Meds: acetaminophen, ondansetron (ZOFRAN) IV   Vital Signs    Vitals:   04/30/17 1447 04/30/17 1703 04/30/17 1936 05/01/17 0559  BP: 100/60 112/66 (!) 109/59 112/86  Pulse: 77 82 91 94  Resp: 18 19 17 17   Temp: 98.7 F (37.1 C) 98.5 F (36.9 C) 98.2 F (36.8 C) 98.1 F (36.7 C)  TempSrc:  Oral Oral Oral  SpO2: 92% 96% 93% 95%  Weight:    137 lb 6.4 oz (62.3 kg)  Height:        Intake/Output Summary (Last 24 hours) at 05/01/2017 5456 Last data filed at 05/01/2017 2563 Gross per 24 hour  Intake 762.92 ml  Output 950 ml  Net -187.08 ml   Filed Weights   04/29/17 0443 04/30/17 0438 05/01/17 0559  Weight: 138 lb 3.7 oz (62.7 kg) 140 lb 3.4 oz (63.6 kg) 137 lb 6.4 oz (62.3 kg)    Telemetry    Afib rate controlled - Personally Reviewed  ECG    Afib still presented but improved ST elevations in anterolateral leads  - Personally Reviewed  Physical Exam   General: Thin older W male appearing in no acute distress. Head: Normocephalic, atraumatic.  Neck: Supple, no JVD. Lungs:  Resp regular and unlabored, CTA. Heart: Irreg Irreg, S1, S2, no S3, S4, or murmur; no rub. Abdomen: Soft, non-tender, non-distended with normoactive bowel sounds. Extremities: No clubbing, cyanosis, edema. Distal pedal pulses are 2+ bilaterally. Neuro: Alert and oriented X 3. Moves all extremities spontaneously. Psych: Normal affect.  Labs      Chemistry Recent Labs  Lab 04/28/17 1026  04/29/17 0441 04/30/17 0342 05/01/17 0437  NA 134*   < > 133* 132* 133*  K 4.3   < > 4.5 4.5 4.0  CL 103   < > 104 102 104  CO2 19*  --  17* 15* 13*  GLUCOSE 197*   < > 182* 129* 160*  BUN 19   < > 27* 47* 53*  CREATININE 1.37*   < > 1.99* 2.56* 2.11*  CALCIUM 8.8*  --  8.1* 8.0* 8.2*  PROT 6.8  --   --   --   --   ALBUMIN 3.0*  --   --   --   --   AST 21  --   --   --   --   ALT 13*  --   --   --   --   ALKPHOS 61  --   --   --   --   BILITOT 1.0  --   --   --   --   GFRNONAA 45*  --  28* 21* 27*  GFRAA 52*  --  33* 24* 31*  ANIONGAP 12  --  12 15 16*   < > = values in this interval not displayed.  Hematology Recent Labs  Lab 04/29/17 0441 04/30/17 0342 04/30/17 2304 05/01/17 0437  WBC 12.1* 10.5  --  10.3  RBC 2.95* 2.64*  --  3.82*  HGB 8.6* 7.7* 10.7* 11.4*  HCT 27.4* 24.2* 32.0* 34.0*  MCV 92.9 91.7  --  89.0  MCH 29.2 29.2  --  29.8  MCHC 31.4 31.8  --  33.5  RDW 16.5* 16.7*  --  16.6*  PLT 184 182  --  178    Cardiac Enzymes Recent Labs  Lab 04/28/17 1602 04/28/17 1858 04/28/17 2056 04/29/17 0754  TROPONINI 0.08* 0.09* 0.09* 0.05*    Recent Labs  Lab 04/28/17 1035  TROPIPOC 0.15*     BNP Recent Labs  Lab 04/28/17 1026  BNP 509.7*     DDimer No results for input(s): DDIMER in the last 168 hours.    Radiology    No results found.  Cardiac Studies   Echo: 04/29/17  Study Conclusions  - Left ventricle: The cavity size was normal. There was mild   concentric hypertrophy. Systolic function was mildly to   moderately reduced. The estimated ejection fraction was in the   range of 40% to 45%. Wall motion was normal; there were no   regional wall motion abnormalities. Features are consistent with   a pseudonormal left ventricular filling pattern, with concomitant   abnormal relaxation and increased filling pressure (grade 2   diastolic dysfunction). Doppler parameters are consistent  with   elevated ventricular end-diastolic filling pressure. - Aortic valve: Transvalvular velocity was minimally increased.   There was no stenosis. - Mitral valve: Calcified annulus. Mildly thickened leaflets .   There was mild regurgitation. - Right ventricle: Systolic function was normal. - Right atrium: The atrium was normal in size. - Tricuspid valve: There was mild regurgitation. - Pulmonary arteries: Systolic pressure was mildly increased. PA   peak pressure: 37 mm Hg (S). - Inferior vena cava: The vessel was normal in size. - Pericardium, extracardiac: There was no pericardial effusion.  Impressions:  - LVEF is mildly to moderately decreased, there is diffuse   hypokinesis, more pronounced in the basal and mid inferior and   inferolateral walls.  Patient Profile     84 y.o. male a hx of myelodysplastic syndrome, HTN, DM, HLD, CKD stage III who was seen for the evaluation ofchest pain and abnormal EKG.  Assessment & Plan    1. Chest pain/Abnormal EKG: EKG with diffuse ST elevation on 2/18. No further episodes of chest pain. He certainly can have CAD but not a candidate for invasive evaluation. He is DNR and not interested any evaluation. Echo showed EF of 40-45% with diffuse hypokinesis in the basal/inferolateral walls. No pericardial effusion. EKG this morning with continued Afib but overall improved. At this time would continue to treat medically, no plans for invasive work up. Consider adding EC ASA as outpatient if ok from GI standpoint.   2. New onset paroxysmal atrial fibrillation: Remains on Afib, but rates controlled CHADSVASC score of 4. [place] on hold 2/2 to GI bleed.  3. HTN - on coreg 6.25mg  BID  4. DM - per primary team  5. Acute anemia with MDS and + FOBT - s/p transfused 1 unit of PRBCs with initial improvement. H/H 7.7/24.2 yesterday Transfused 2 units PRBCs yesterday H/H 11.4/34.0 - plans to f/u with GI as outpatient  6. Acute on CKD stage III -  Scr of 1.99>>2.56>>2.11. Baseline around 1.2-1.3. Avoid nephrotoxic agent.  Signed, Reino Bellis, NP  05/01/2017, 9:38 AM  Pager # [phone]   For questions or updates, please contact [place] Please consult [email or web address] for contact info under Cardiology/STEMI.  Patient seen and examined. Agree with assessment and plan. No chest pain. AF rate controlled, can further titrate coreg if needed. H/H improved to 11.4/34 post PRBCs tx yesterday.  Renal function improving; Cr 2.11.    Troy Sine, MD, [facility] 05/01/2017 12:13 PM

## 2017-05-01 NOTE — Progress Notes (Signed)
Occupational Therapy Treatment Patient Details Name: Clayton Richards MRN: [id number] DOB: 08-14-1928 Today's Date: 05/01/2017    History of present illness Pt is an 84 y.o. male who was admitted with chest pain. He has a PMH significant for anemia, anxiety, depression, diabetes mellitus without complication, hyperlipidemia, hypertension, neuropathy, nyelodysplastic syndrome, anxiety, Sjogren's syndrome, CKD III, and prostate cancer.    OT comments  Pt reports preparing to D/C this afternoon. He has been provided w/c and educated pt concerning wheelchair mobility for community use and to increase ability to participate in ADL/IADL. Also discussed recommendation to ambulate with RW during ADL as activity tolerance allows to improve strength. Pt able to navigate obstacles with wheelchair during simulated IADL without assistance. Pt demonstrating decreased stability during toilet transfers and LB ADL at EOB today requiring min assist for both. Family present during education and are prepared to assist. HR up to 145 today during seated LB ADL and 120s during w/c mobilitiy and pt continues to demonstrate decreased activity tolerance for ADL. D/C recommendation remains appropriate and will continue to follow while admitted.    Follow Up Recommendations  Home health OT;Supervision/Assistance - 24 hour    Equipment Recommendations  None recommended by OT    Recommendations for Other Services      Precautions / Restrictions Precautions Precautions: Fall Restrictions Weight Bearing Restrictions: No       Mobility Bed Mobility Overal bed mobility: Needs Assistance Bed Mobility: Supine to Sit     Supine to sit: Min guard     General bed mobility comments: significantly increased time with min guard for safety  Transfers Overall transfer level: Needs assistance Equipment used: Rolling walker (2 wheeled)   Sit to Stand: From elevated surface;Min assist         General transfer  comment: Min assist to power up from bed this session. Posterior bias noted and required cues for hand placement.     Balance Overall balance assessment: Needs assistance Sitting-balance support: No upper extremity supported;Feet supported Sitting balance-Leahy Scale: Good     Standing balance support: Bilateral upper extremity supported;During functional activity;Single extremity supported Standing balance-Leahy Scale: Poor Standing balance comment: relies on UE support                           ADL either performed or assessed with clinical judgement   ADL Overall ADL's : Needs assistance/impaired                     Lower Body Dressing: Sit to/from stand;Minimal assistance;Min guard Lower Body Dressing Details (indicate cue type and reason): Multiple LOB at EOB requiring min assist to correct. No assistance for donning shoes themselves.  Toilet Transfer: Minimal Insurance claims handler Details (indicate cue type and reason): Min assist to power up. Limited by significant posterior bias.          Functional mobility during ADLs: Minimal assistance;Rolling walker General ADL Comments: Pt with increased instability on his feet today. Educated concerning activity progression once he is home to maximize safety. Pt educated on wheelchair mobility for IADL and he was able to self-propel in hallway and around obstacles. Noted HR up to 145 at one time seated at EOB during session. Otherwise sustained 120s during w/c mobility.      Vision   Vision Assessment?: No apparent visual deficits   Perception     Praxis      Cognition Arousal/Alertness: Awake/alert Behavior During Therapy:  WFL for tasks assessed/performed Overall Cognitive Status: Within Functional Limits for tasks assessed                                          Exercises     Shoulder Instructions       General Comments      Pertinent Vitals/ Pain       Pain  Assessment: No/denies pain  Home Living                                          Prior Functioning/Environment              Frequency  Min 2X/week        Progress Toward Goals  OT Goals(current goals can now be found in the care plan section)  Progress towards OT goals: Progressing toward goals  Acute Rehab OT Goals Patient Stated Goal: feel less tired OT Goal Formulation: With patient Time For Goal Achievement: 05/13/17 Potential to Achieve Goals: Good  Plan Discharge plan remains appropriate    Co-evaluation                 AM-PAC PT "6 Clicks" Daily Activity     Outcome Measure   Help from another person eating meals?: A Little Help from another person taking care of personal grooming?: A Little Help from another person toileting, which includes using toliet, bedpan, or urinal?: A Little Help from another person bathing (including washing, rinsing, drying)?: A Little Help from another person to put on and taking off regular upper body clothing?: A Little Help from another person to put on and taking off regular lower body clothing?: A Little 6 Click Score: 18    End of Session Equipment Utilized During Treatment: Rolling walker(wheelchair)  OT Visit Diagnosis: Other abnormalities of gait and mobility (R26.89);Muscle weakness (generalized) (M62.81)   Activity Tolerance Patient tolerated treatment well   Patient Left with family/visitor present(in wheelchair (DME for home use) preparing for D/C )   Nurse Communication Mobility status        Time: 5176-1607 OT Time Calculation (min): 13 min  Charges: OT General Charges $OT Visit: 1 Visit OT Treatments $Self Care/Home Management : 8-22 mins  Norman Herrlich, MS OTR/L  Pager: [facility] Aval[facility] 05/01/2017, 5:27 PM

## 2017-05-02 ENCOUNTER — Other Ambulatory Visit: Payer: Self-pay | Admitting: Hematology and Oncology

## 2017-05-02 ENCOUNTER — Telehealth: Payer: Self-pay

## 2017-05-02 ENCOUNTER — Telehealth: Payer: Self-pay | Admitting: Cardiovascular Disease

## 2017-05-02 MED ORDER — MORPHINE SULFATE (CONCENTRATE) 10 MG /0.5 ML PO SOLN: 10.0000 mg | ORAL | 0 refills | Status: AC | PRN

## 2017-05-02 NOTE — Telephone Encounter (Signed)
1605- Received a call from patient's daughter and she stated patient is refusing to go to the ED. Patient's daughter stated that Hospice is working on getting someone out to see the patient this evening.

## 2017-05-02 NOTE — Telephone Encounter (Signed)
She wants to know if Dr Claiborne Billings would be the Attending Doctor for Hospice please?

## 2017-05-02 NOTE — Telephone Encounter (Signed)
29- Spoke to patient's wife who is upset that Hospice will not be able to come out for an initial assessment until tomorrow. Informed patient's wife that a Hospice consult was placed after her daughter called this am and that Lorelei Pont at [facility] is working on referral. Listened to concerns and offered support.  Called and left voice message with Amy to follow up. Patient's wife also stated that patient's "abdomen seems to be getting larger" but she's not really sure. Asked patient's wife if abdomen has increased in size and if patient is having abdominal pain. She stated patient has generalized pain, no fever, no shortness of breath. Patient's wife stated that she could not get him here to be seen in symptom management. Informed patient's wife that patient will need to go the Emergency Room for assessment if abdomen is increasing in size or increase in pain. Also called patient's daughter and made her aware of her mother's concerns.  1554- Amy called from [facility] and stated patient will be seen tomorrow.

## 2017-05-02 NOTE — Telephone Encounter (Signed)
Attempt to return call, no answer and unable to leave VM

## 2017-05-02 NOTE — Telephone Encounter (Signed)
1010am- Patient's daughter called requesting a referral to [facility]ospice and [facility] for the patient. Called [facility] and spoke to Amy and gave her the patient's information. Stated she will work on referral.

## 2017-05-06 NOTE — Telephone Encounter (Signed)
Spoke with hospice, they have dr badger as the attending for hospice.

## 2017-05-08 ENCOUNTER — Other Ambulatory Visit: Payer: Medicare Other

## 2017-05-10 DEATH — deceased

## 2017-05-11 NOTE — Progress Notes (Signed)
[name] Follow-up Visit:  Assessment: Myelodysplastic syndrome [facility]) 84 y.o. male with diagnosis of Sjogren's syndrome referred to the clinic for bicytopenia with leukopenia and anemia.  Review of the hematological trends demonstrates progressive neutropenia and lymphopenia with associated normocytic normochromic anemia with trends towards macrocytosis.  Differential diagnosis is broad and includes development of myelodysplasia, acute myeloid leukemia, or development of systemic autoimmune condition such as systemic lupus.   Patient underwent bone marrow biopsy which demonstrates abnormal bone marrow with hypercellular tissue with increased granulocytic precursors without evidence of increased blasts.  At this time, this rules out acute myelogenous leukemia.  Nevertheless, the abnormalities, could be attributed to myelodysplastic syndrome or known autoimmune condition of Sjogren's syndrome.  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  Such translocations are frequently associated with myeloproliferative or myelodysplastic processes supporting the diagnosis of myelodysplastic syndrome.  At this time, white blood cell count has improved slightly with lymphocyte count returning back to normal and patient having mild neutropenia.  Since the last visit to the clinic, there has been no interval decline in the hemoglobin level now approaching transfusion threshold.  Plan: -At this time, we need to make decision whether to proceed with supportive care using erythropoietin and G-CSF versus initiation of direct therapy with decitabine versus continued observation and supportive care with interval transfusions. -Return to clinic in 1 week with labs,  possible transfusion.   Voice recognition software was used and creation of this note. Despite my best effort at editing the text, some misspelling/errors may have occurred.  Orders Placed This Encounter  Procedures  . Erythropoietin    Standing Status:   Future    Number of Occurrences:   1    Standing Expiration Date:   04/15/2018  . Anti-DNA antibody, double-stranded    Standing Status:   Future    Number of Occurrences:   1    Standing Expiration Date:   04/15/2018  . ANCA Titers    Standing Status:   Future    Number of Occurrences:   1    Standing Expiration Date:   04/15/2018  . Centromere Antibodies    Standing Status:   Future    Number of Occurrences:   1    Standing Expiration Date:   04/15/2018  . CBC with Differential (Cancer Center Only)    Standing Status:   Future    Number of Occurrences:   1    Standing Expiration Date:   04/15/2018  . Vitamin B6    Standing Status:   Future    Number of Occurrences:   1    Standing Expiration Date:   04/15/2018  . Sample to Blood Bank    Standing Status:   Future    Number of Occurrences:   1    Standing Expiration Date:   04/15/2018   All questions were answered.  . The patient knows to call the clinic with any problems, questions or concerns.  This note was electronically signed.    History of Presenting Illness Clayton Richards is an 84 y.o. male followed in the [place] for anemia and lymphopenia, referred by Dr Anastasia Pall.  Patient's past medical history is significant for history of prostate cancer at the age of 81 with 5 weeks of radiation  and Lupron.  Over the past 3 years, patient has been having peripheral neuropathy with gradual progression. He also has been diagnosed with Sjogren's syndrome and is being followed by rheumatology.  Patient is a lifelong non-smoker and does not drink alcohol.  At this time, patient denies fevers, chills, night sweats.  No recurrent infections.  Denies unexpected weight loss.   Continues to have peripheral neuropathy especially in the lower extremities.  No loss of strength or significant imbalance at this time.  Patient denies chest pain, shortness of breath, or cough.  No nausea, vomiting, abdominal pain, early satiety, diarrhea, or constipation.  No dysuria or hematuria.  Patient returns to the clinic after undergoing bone marrow biopsy for assessment.  He denies any new symptoms since the last visit other than continued fatigue and dry mouth causing some swallowing difficulties that he attributes to his known Sjogren's disease.  Oncological/hematological History: --Labs, 02/18/12: WBC 4.6, ANC 2.9, ALC 1.1, Mono 0.5, Hgb 10.8, MCV 88.0, MCH 28.1, RDW 15.1, Plt 339 --Labs, 08/20/12: WBC 5.0, ANC 3.3, ALC 1.2, Mono 0.4, Hgb 11.3, MCV 86.0, MCH 28.4, RDW 15.1, Plt 337  --Labs, 04/16/16: WBC 2.8, ANC 1.7, ALC 0.8, Mono 0.4, Hgb 10.4, MCV 87.0, MCH 27.7, RDW 16.3, Plt 282 --Labs, 12/27/16: WBC 2.6, ANC 1.6, ALC 0.4, Mono 0.4, Hgb   9.8, MCV 93.0, MCH 29.9, RDW 16.1, Plt 262; --Labs, 03/07/17: WBC 3.3, ANC 1.5, ALC 0.8, Mono 1.0, Hgb   9.1, MCV 93.5, MCH 29.7, RDW 16.9, Plt 247; --BM Bx, 03/07/17: Hypercellular bone marrow with increased granulocytic precursors without increase in blasts.  Differential includes nutritional deficiency, immunological injury of the bone marrow, or possible myelodysplastic syndrome; CytoGen -- 3 cell lines identified, #1 -- ~10%, 46,XY, #2 -- 45,X- (loss of Y chromosome), #3 -- 46,XY,der(15)t(1;15)(q12p11.2); FISH -- normal. --Labs, 04/15/17: WBC 2.6, ANC 1.0, ALC 0.7, Mono 0.9, Hgb   8.1,             Plt 214   Medical History: Past Medical History:  Diagnosis Date  . Anemia   . Anxiety   . Depression   . Diabetes mellitus without complication ([place])   . Hyperlipidemia   . Hypertension   . Neuropathy   . Prostate CA [facility])     Surgical History: Past Surgical History:  Procedure Laterality Date  . CATARACT EXTRACTION  2016  .  CHOLECYSTECTOMY  1975  . PROSTATE SURGERY  2002    Family History: History reviewed. No pertinent family history.  Social History: Social History   Socioeconomic History  . Marital status: Married    Spouse name: Not on file  . Number of children: 3  . Years of education: 26  . Highest education level: Not on file  Social Needs  . Financial resource strain: Not on file  . Food insecurity - worry: Not on file  . Food insecurity - inability: Not on file  . Transportation needs - medical: Not on file  . Transportation needs - non-medical: Not on file  Occupational History  . Occupation: Retired  Tobacco Use  . Smoking status: Former Smoker    Last attempt to quit: 1988    Years since quitting: 31.1  . Smokeless tobacco: Never Used  Substance and Sexual Activity  . Alcohol use: No    Alcohol/week: 0.0 oz  . Drug use: No  . Sexual activity: No  Other Topics Concern  . Not on file  Social History Narrative   Lives at home w/  his wife   Right-handed   Caffeine: occasional decaf coffee    Allergies: Allergies  Allergen Reactions  . Contrast Media [Iodinated Diagnostic Agents] Other (See Comments)    Developed AKI 24-48 hrs after contrast    Medications:  Current Outpatient Medications  Medication Sig Dispense Refill  . amLODipine-atorvastatin (CADUET) 5-20 MG per tablet Take 1 tablet by mouth daily.    Marland Kitchen buPROPion (WELLBUTRIN XL) 300 MG 24 hr tablet Take 300 mg by mouth daily.    . calcium carbonate (OS-CAL) 600 MG TABS tablet Take 600 mg by mouth 2 (two) times daily with a meal.    . carvedilol (COREG) 6.25 MG tablet Take 1 tablet (6.25 mg total) by mouth 2 (two) times daily with a meal. 60 tablet 0  . Cholecalciferol (VITAMIN D) 2000 units CAPS Take 2,000 Units by mouth daily.    . Cyanocobalamin (VITAMIN B 12 PO) Take 5,000 mcg by mouth daily.    . ferrous sulfate 325 (65 FE) MG tablet Take 325 mg by mouth 3 (three) times daily with meals.     . metFORMIN  (GLUCOPHAGE) 1000 MG tablet Take 1,000 mg by mouth 2 (two) times daily with a meal.    . Morphine Sulfate (MORPHINE CONCENTRATE) 10 mg / 0.5 ml concentrated solution Take 0.5 mLs (10 mg total) by mouth every 4 (four) hours as needed for up to 14 days for severe pain or shortness of breath. 30 mL 0  . Omega-3 Fatty Acids (OMEGA-3 FISH OIL PO) Take by mouth.    . Probiotic Product (ALIGN PO) Take 1 tablet by mouth daily.    . sitaGLIPtin (JANUVIA) 50 MG tablet Take 50 mg by mouth daily.    . Sodium Bicarbonate-Citric Acid (ALKA-SELTZER HEARTBURN PO) Take 1 tablet by mouth as needed (indigestion).    . valsartan-hydrochlorothiazide (DIOVAN-HCT) 320-12.5 MG per tablet Take 1 tablet by mouth daily.     No current facility-administered medications for this visit.     Review of Systems: Review of Systems  Constitutional: Positive for fatigue.  Neurological: Positive for numbness. Negative for extremity weakness.  All other systems reviewed and are negative.    PHYSICAL EXAMINATION Blood pressure (!) 136/59, pulse 78, temperature 98.6 F (37 C), temperature source Oral, resp. rate 17, height '5\' 10"'$  (1.778 m), weight 137 lb 6.4 oz (62.3 kg), SpO2 100 %.  ECOG PERFORMANCE STATUS: 1 - Symptomatic but completely ambulatory  Physical Exam  Constitutional: He is oriented to person, place, and time and well-developed, well-nourished, and in no distress. No distress.  HENT:  Head: Normocephalic and atraumatic.  Mouth/Throat: Oropharynx is clear and moist. No oropharyngeal exudate.  Eyes: Conjunctivae are normal. No scleral icterus.  Neck: No thyromegaly present.  Cardiovascular: Normal rate, normal heart sounds and intact distal pulses.  No murmur heard. Pulmonary/Chest: Effort normal and breath sounds normal. No respiratory distress. He has no wheezes. He has no rales. He exhibits no tenderness.  Abdominal: Soft. Bowel sounds are normal. He exhibits no distension. There is no tenderness. There is  no rebound.  Musculoskeletal: He exhibits no edema.  Lymphadenopathy:    He has no cervical adenopathy.  Neurological: He is alert and oriented to person, place, and time. He has normal reflexes. No cranial nerve deficit.  Skin: Skin is warm and dry. No rash noted. He is not diaphoretic. No erythema. No pallor.     LABORATORY DATA: I have personally reviewed the data as listed: Appointment on 04/15/2017  Component Date Value  Ref Range Status  . Vitamin B6 04/15/2017 2.8* 5.3 - 46.7 ug/L Final   Comment: (NOTE) This test was developed and its performance characteristics determined by [facility]. It has not been cleared or approved by the Food and Drug Administration. Performed At: [facility] [place], [place] [id number] Rush Farmer MD [id number] Performed at [facility], [place] [address], [place], [place] [address]   . Centromere Ab Screen 04/15/2017 <0.2  0.0 - 0.9 AI Final   Comment: (NOTE) Performed At: [facility] [place], [place] [id number] Rush Farmer MD [id number] Performed at [facility], [place] [address], [place], [place] [address]   . C-ANCA 04/15/2017 <1:20  Neg:<1:20 titer Final  . P-ANCA 04/15/2017 <1:20  Neg:<1:20 titer Final   Comment: (NOTE) The presence of positive fluorescence exhibiting P-ANCA or C-ANCA patterns alone is not specific for the diagnosis of Wegener's Granulomatosis (WG) or microscopic polyangiitis. Decisions about treatment should not be based solely on ANCA IFA results.  The International ANCA Group Consensus recommends follow up testing of positive sera with both PR-3 and MPO-ANCA enzyme immunoassays. As many as 5% serum samples are positive only by EIA. Ref. AM J Clin Pathol 1999;111:507-513.   Marland Kitchen Atypical P-ANCA titer 04/15/2017 <1:20  Neg:<1:20 titer Final   Comment: (NOTE) The atypical pANCA pattern has been observed in a  significant percentage of patients with ulcerative colitis, primary sclerosing cholangitis and autoimmune hepatitis. Performed At: [facility] [place], [place] [id number] Rush Farmer MD [id number] Performed at [facility], [place] [address] [place], [place] [address]   . ds DNA Ab 04/15/2017 1  0 - 9 IU/mL Final   Comment: (NOTE)                                   Negative      <5                                   Equivocal  5 - 9                                   Positive      >9 Performed At: [facility] [place], [place] [id number] Rush Farmer MD [id number] Performed at [facility], [place] [address], [place], [place] [address]   . Erythropoietin 04/15/2017 48.5* 2.6 - 18.5 mIU/mL Final   Comment: (NOTE) Beckman Coulter UniCel DxI [place] obtained with different assay methods or kits cannot be used interchangeably. Results cannot be interpreted as absolute evidence of the presence or absence of malignant disease. Performed At: [facility] [place], [place] [id number] Rush Farmer MD [id number] Performed at [facility], [place] [address] [place], [place] [address]   Appointment on 04/15/2017  Component Date Value Ref Range Status  . LDH 04/15/2017 189  125 - 245 U/L Final   Performed at [facility], [place] [address] [place],  [address]  . Sodium 04/15/2017 139  136 - 145 mmol/L Final  . Potassium 04/15/2017 4.6  3.5 - 5.1 mmol/L Final  . Chloride 04/15/2017 107  98 - 109 mmol/L Final  .  CO2 04/15/2017 22  22 - 29 mmol/L Final  . Glucose, Bld 04/15/2017 60* 70 - 140 mg/dL Final  . BUN 04/15/2017 27* 7 - 26 mg/dL Final  . Creatinine, Ser 04/15/2017 1.28  0.70 - 1.30 mg/dL Final  . Calcium 04/15/2017 9.3  8.4 - 10.4 mg/dL Final  . Total Protein 04/15/2017 7.1  6.4 -  8.3 g/dL Final  . Albumin 04/15/2017 3.3* 3.5 - 5.0 g/dL Final  . AST 04/15/2017 20  5 - 34 U/L Final  . ALT 04/15/2017 16  0 - 55 U/L Final  . Alkaline Phosphatase 04/15/2017 74  40 - 150 U/L Final  . Total Bilirubin 04/15/2017 0.3  0.2 - 1.2 mg/dL Final  . GFR calc non Af Amer 04/15/2017 49* >60 mL/min Final  . GFR calc Af Amer 04/15/2017 56* >60 mL/min Final   Comment: (NOTE) The eGFR has been calculated using the CKD EPI equation. This calculation has not been validated in all clinical situations. eGFR's persistently <60 mL/min signify possible Chronic Kidney Disease.   Georgiann Hahn gap 04/15/2017 10  3 - 11 Final   Performed at [facility], [place] [address], [place], [place] [address]  . BCR ABL1 / ABL1 04/15/2017 See Scanned report in [place]   Final   Performed at [facility], [address], [place], [place] [address]  . WBC 04/15/2017 2.6* 4.0 - 10.3 K/uL Final  . RBC 04/15/2017 2.72* 4.20 - 5.82 MIL/uL Final  . Hemoglobin 04/15/2017 8.1* 13.0 - 17.1 g/dL Final  . HCT 04/15/2017 25.2* 38.4 - 49.9 % Final  . MCV 04/15/2017 92.5  79.3 - 98.0 fL Final  . MCH 04/15/2017 29.9  27.2 - 33.4 pg Final  . MCHC 04/15/2017 32.3  32.0 - 36.0 g/dL Final  . RDW 04/15/2017 17.7* 11.0 - 14.6 % Final  . Platelets 04/15/2017 214  140 - 400 K/uL Final  . Smear Review 04/15/2017 Rare metas and myelocytes present   Final  . Neutrophils Relative % 04/15/2017 38  % Final  . Neutro Abs 04/15/2017 1.0* 1.5 - 6.5 K/uL Final  . Lymphocytes Relative 04/15/2017 26  % Final  . Lymphs Abs 04/15/2017 0.7* 0.9 - 3.3 K/uL Final  . Monocytes Relative 04/15/2017 35  % Final  . Monocytes Absolute 04/15/2017 0.9  0.1 - 0.9 K/uL Final  . Eosinophils Relative 04/15/2017 1  % Final  . Eosinophils Absolute 04/15/2017 0.0  0.0 - 0.5 K/uL Final  . Basophils Relative 04/15/2017 0  % Final  . Basophils Absolute 04/15/2017 0.0  0.0 - 0.1 K/uL Final   Performed at  [facility], [place] [address], [place], [place] [address]  . Retic Ct Pct 04/15/2017 2.0* 0.8 - 1.8 % Final  . RBC. 04/15/2017 2.75* 4.20 - 5.82 MIL/uL Final  . Retic Count, Absolute 04/15/2017 55.0  34.8 - 93.9 K/uL Final   Performed at [facility], [place] [address], [place], [place] [address]       Ardath Sax, MD

## 2017-05-11 NOTE — Assessment & Plan Note (Signed)
84 y.o. male with diagnosis of Sjogren's syndrome referred to the clinic for bicytopenia with leukopenia and anemia.  Review of the hematological trends demonstrates progressive neutropenia and lymphopenia with associated normocytic normochromic anemia with trends towards macrocytosis.  Differential diagnosis is broad and includes development of myelodysplasia, acute myeloid leukemia, or development of systemic autoimmune condition such as systemic lupus.   Patient underwent bone marrow biopsy which demonstrates abnormal bone marrow with hypercellular tissue with increased granulocytic precursors without evidence of increased blasts.  At this time, this rules out acute myelogenous leukemia.  Nevertheless, the abnormalities, could be attributed to myelodysplastic syndrome or known autoimmune condition of Sjogren's syndrome.  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  Such translocations are frequently associated with myeloproliferative or myelodysplastic processes supporting the diagnosis of myelodysplastic syndrome.  At this time, white blood cell count has improved slightly with lymphocyte count returning back to normal and patient having mild neutropenia.  Since the last visit to the clinic, there has been no interval decline in the hemoglobin level now approaching transfusion threshold.  Plan: -At this time, we need to make decision whether to proceed with supportive care using erythropoietin and G-CSF versus initiation of direct therapy with decitabine versus continued observation and supportive care with interval transfusions. -Return to clinic in 1 week with labs, possible transfusion.

## 2017-05-14 NOTE — Progress Notes (Signed)
[facility] Follow-up Visit:  Assessment: Myelodysplastic syndrome [facility]) 84 y.o. male with diagnosis of Sjogren's syndrome referred to the clinic for bicytopenia with leukopenia and anemia.  Review of the hematological trends demonstrates progressive neutropenia and lymphopenia with associated normocytic normochromic anemia with trends towards macrocytosis.  Differential diagnosis is broad and includes development of myelodysplasia, acute myeloid leukemia, or development of systemic autoimmune condition such as systemic lupus.   Patient underwent bone marrow biopsy which demonstrates abnormal bone marrow with hypercellular tissue with increased granulocytic precursors without evidence of increased blasts.  At this time, this rules out acute myelogenous leukemia.  Nevertheless, the abnormalities, could be attributed to myelodysplastic syndrome or known autoimmune condition of Sjogren's syndrome.  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  Such translocations are frequently associated with myeloproliferative or myelodysplastic processes supporting the diagnosis of myelodysplastic syndrome.  At this time, white blood cell count has improved slightly with lymphocyte count returning back to normal and patient having mild neutropenia.  Since the last visit to the clinic, there has been an interval decline in the hemoglobin level now approaching transfusion threshold.  Plan: -At this time, we need to make decision whether to proceed with supportive care using erythropoietin and G-CSF versus initiation of direct therapy with decitabine versus continued observation and supportive care with interval transfusions. -Weekly hemoglobin check, transfuse 1 unit  of leuko-reduced and irradiated packed red blood cells if hemoglobin is less or equal to 8.0 g/dL. -Patient is to start B6 vitamin replacement with use of stress B complex. -Return to clinic in 4 weeks for continued hematological monitoring.  Voice recognition software was used and creation of this note. Despite my best effort at editing the text, some misspelling/errors may have occurred.  Orders Placed This Encounter  Procedures  . CBC (Cancer Center Only)    Pre-Transfusion Labs    Standing Status:   Standing    Number of Occurrences:   12    Standing Expiration Date:   04/24/2018  . CBC (Cancer Center Only)    Post Transfusion Labs: collect 51mn to 1 hr after completion of transfusion    Standing Status:   Standing    Number of Occurrences:   4    Standing Expiration Date:   04/24/2018  . Practitioner attestation of consent    I, the ordering practitioner, attest that I have discussed with the patient the benefits, risks, side effects, alternatives, likelihood of achieving goals and potential problems during recovery for the procedure listed.    Standing Status:   Future    Standing Expiration Date:   04/24/2018    Order Specific Question:   Procedure    Answer:   Blood Product(s)  . Complete patient signature process for consent form    Standing Status:   Future    Standing Expiration Date:   04/24/2018  . Care order/instruction    Transfuse Parameters: --Hgb <=8.0 -- transfuse 1unit pRBC, leukoreduced, irradiated    Standing Status:   Future    Standing Expiration Date:   04/24/2018  . Type and screen    Standing Status:   Standing    Number of Occurrences:   12    Standing Expiration Date:   04/24/2018   All questions were answered.  . The patient  knows to call the clinic with any problems, questions or concerns.  This note was electronically signed.    History of Presenting Illness Clayton Richards is an 84 y.o. male followed in the [place] for new diagnosis of  myelodysplastic syndrome, referred by Dr Anastasia Pall.  Patient's past medical history is significant for history of prostate cancer at the age of 30 with 5 weeks of radiation and Lupron.  Over the past 3 years, patient has been having peripheral neuropathy with gradual progression. He also has been diagnosed with Sjogren's syndrome and is being followed by rheumatology.  Patient is a lifelong non-smoker and does not drink alcohol.  At this time, patient denies fevers, chills, night sweats.  No recurrent infections.  Denies unexpected weight loss.  Continues to have peripheral neuropathy especially in the lower extremities.  No loss of strength or significant imbalance at this time.  Patient denies chest pain, shortness of breath, or cough.  No nausea, vomiting, abdominal pain, early satiety, diarrhea, or constipation.  No dysuria or hematuria.  Patient returns to the clinic after undergoing bone marrow biopsy for assessment.  He denies any new symptoms since the last visit other than continued fatigue and dry mouth causing some swallowing difficulties that he attributes to his known Sjogren's disease.  Oncological/hematological History: **Myelodysplastic Syndrome: --Labs, 02/18/12: WBC 4.6, ANC 2.9, ALC 1.1, Mono 0.5, Hgb 10.8, MCV 88.0, MCH 28.1, RDW 15.1, Plt 339 --Labs, 08/20/12: WBC 5.0, ANC 3.3, ALC 1.2, Mono 0.4, Hgb 11.3, MCV 86.0, MCH 28.4, RDW 15.1, Plt 337  --Labs, 04/16/16: WBC 2.8, ANC 1.7, ALC 0.8, Mono 0.4, Hgb 10.4, MCV 87.0, MCH 27.7, RDW 16.3, Plt 282 --Labs, 12/27/16: WBC 2.6, ANC 1.6, ALC 0.4, Mono 0.4, Hgb   9.8, MCV 93.0, MCH 29.9, RDW 16.1, Plt 262; --Labs, 03/07/17: WBC 3.3, ANC 1.5, ALC 0.8, Mono 1.0, Hgb   9.1, MCV 93.5, MCH 29.7, RDW 16.9, Plt 247; --BM Bx, 03/07/17: Hypercellular bone marrow with increased granulocytic precursors without increase in blasts.  Differential includes nutritional deficiency, immunological injury of the bone marrow, or possible myelodysplastic  syndrome; CytoGen -- 3 cell lines identified, #1 -- ~10%, 46,XY, #2 -- 45,X- (loss of Y chromosome), #3 -- 46,XY,der(15)t(1;15)(q12p11.2); FISH -- normal; BCR/ABL FISH -- negative --Labs, 04/15/17: WBC 2.6, ANC 1.0, ALC 0.7, Mono 0.9, Hgb   8.1, MCV 92.5, MCH 29.9, MCHC 32.3, RDW 17.7, Plt 214   Medical History: Past Medical History:  Diagnosis Date  . Anemia   . Anxiety   . Depression   . Diabetes mellitus without complication ([place])   . Hyperlipidemia   . Hypertension   . Neuropathy   . Prostate CA [facility])     Surgical History: Past Surgical History:  Procedure Laterality Date  . CATARACT EXTRACTION  2016  . CHOLECYSTECTOMY  1975  . PROSTATE SURGERY  2002    Family History: History reviewed. No pertinent family history.  Social History: Social History   Socioeconomic History  . Marital status: Married    Spouse name: Not on file  . Number of children: 3  . Years of education: 38  . Highest education level: Not on file  Social Needs  . Financial resource strain: Not on file  . Food insecurity - worry: Not on file  . Food insecurity - inability: Not on file  . Transportation needs - medical: Not on file  . Transportation needs - non-medical: Not on file  Occupational History  . Occupation: Retired  Tobacco Use  .  Smoking status: Former Smoker    Last attempt to quit: 1988    Years since quitting: 31.1  . Smokeless tobacco: Never Used  Substance and Sexual Activity  . Alcohol use: No    Alcohol/week: 0.0 oz  . Drug use: No  . Sexual activity: No  Other Topics Concern  . Not on file  Social History Narrative   Lives at home w/ his wife   Right-handed   Caffeine: occasional decaf coffee    Allergies: Allergies  Allergen Reactions  . Contrast Media [Iodinated Diagnostic Agents] Other (See Comments)    Developed AKI 24-48 hrs after contrast    Medications:  Current Outpatient Medications  Medication Sig Dispense Refill  . amLODipine-atorvastatin  (CADUET) 5-20 MG per tablet Take 1 tablet by mouth daily.    Marland Kitchen buPROPion (WELLBUTRIN XL) 300 MG 24 hr tablet Take 300 mg by mouth daily.    . calcium carbonate (OS-CAL) 600 MG TABS tablet Take 600 mg by mouth 2 (two) times daily with a meal.    . ferrous sulfate 325 (65 FE) MG tablet Take 325 mg by mouth 3 (three) times daily with meals.     . metFORMIN (GLUCOPHAGE) 1000 MG tablet Take 1,000 mg by mouth 2 (two) times daily with a meal.    . Omega-3 Fatty Acids (OMEGA-3 FISH OIL PO) Take by mouth.    . sitaGLIPtin (JANUVIA) 50 MG tablet Take 50 mg by mouth daily.    . valsartan-hydrochlorothiazide (DIOVAN-HCT) 320-12.5 MG per tablet Take 1 tablet by mouth daily.    . carvedilol (COREG) 6.25 MG tablet Take 1 tablet (6.25 mg total) by mouth 2 (two) times daily with a meal. 60 tablet 0  . Cholecalciferol (VITAMIN D) 2000 units CAPS Take 2,000 Units by mouth daily.    . Cyanocobalamin (VITAMIN B 12 PO) Take 5,000 mcg by mouth daily.    . Morphine Sulfate (MORPHINE CONCENTRATE) 10 mg / 0.5 ml concentrated solution Take 0.5 mLs (10 mg total) by mouth every 4 (four) hours as needed for up to 14 days for severe pain or shortness of breath. 30 mL 0  . Probiotic Product (ALIGN PO) Take 1 tablet by mouth daily.    . Sodium Bicarbonate-Citric Acid (ALKA-SELTZER HEARTBURN PO) Take 1 tablet by mouth as needed (indigestion).     No current facility-administered medications for this visit.     Review of Systems: Review of Systems  Constitutional: Positive for fatigue.  Neurological: Positive for numbness. Negative for extremity weakness.  All other systems reviewed and are negative.    PHYSICAL EXAMINATION Blood pressure (!) 127/56, pulse 100, temperature 98.5 F (36.9 C), temperature source Oral, resp. rate 17, height _0  (1.778 m), weight 139 lb 6.4 oz (63.2 kg), SpO2 98 %.  ECOG PERFORMANCE STATUS: 1 - Symptomatic but completely ambulatory  Physical Exam  Constitutional: He is oriented to  person, place, and time and well-developed, well-nourished, and in no distress. No distress.  HENT:  Head: Normocephalic and atraumatic.  Mouth/Throat: Oropharynx is clear and moist. No oropharyngeal exudate.  Eyes: Conjunctivae are normal. No scleral icterus.  Neck: No thyromegaly present.  Cardiovascular: Normal rate, normal heart sounds and intact distal pulses.  No murmur heard. Pulmonary/Chest: Effort normal and breath sounds normal. No respiratory distress. He has no wheezes. He has no rales. He exhibits no tenderness.  Abdominal: Soft. Bowel sounds are normal. He exhibits no distension. There is no tenderness. There is no rebound.  Musculoskeletal: He exhibits no  edema.  Lymphadenopathy:    He has no cervical adenopathy.  Neurological: He is alert and oriented to person, place, and time. He has normal reflexes. No cranial nerve deficit.  Skin: Skin is warm and dry. No rash noted. He is not diaphoretic. No erythema. No pallor.     LABORATORY DATA: I have personally reviewed the data as listed: Appointment on 04/24/2017  Component Date Value Ref Range Status  . WBC Count 04/24/2017 3.4* 4.0 - 10.3 K/uL Final  . RBC 04/24/2017 2.71* 4.20 - 5.82 MIL/uL Final  . Hemoglobin 04/24/2017 8.1* 13.0 - 17.1 g/dL Final  . HCT 04/24/2017 25.2* 38.4 - 49.9 % Final  . MCV 04/24/2017 93.0  79.3 - 98.0 fL Final  . MCH 04/24/2017 29.9  27.2 - 33.4 pg Final  . MCHC 04/24/2017 32.2  32.0 - 36.0 g/dL Final  . RDW 04/24/2017 17.8* 11.0 - 14.6 % Final  . Platelet Count 04/24/2017 226  140 - 400 K/uL Final  . Smear Review 04/24/2017 Few metas and myelocytes present, few dysplastic neutrophils   Final  . Neutrophils Relative % 04/24/2017 48  % Final  . Neutro Abs 04/24/2017 1.6  1.5 - 6.5 K/uL Final  . Lymphocytes Relative 04/24/2017 20  % Final  . Lymphs Abs 04/24/2017 0.7* 0.9 - 3.3 K/uL Final  . Monocytes Relative 04/24/2017 32  % Final  . Monocytes Absolute 04/24/2017 1.1* 0.1 - 0.9 K/uL Final   . Eosinophils Relative 04/24/2017 0  % Final  . Eosinophils Absolute 04/24/2017 0.0  0.0 - 0.5 K/uL Final  . Basophils Relative 04/24/2017 0  % Final  . Basophils Absolute 04/24/2017 0.0  0.0 - 0.1 K/uL Final   Performed at [facility], [place] [address], [place], [place] [address]  . Blood Bank Specimen 04/24/2017 SAMPLE AVAILABLE FOR TESTING   Final  . Sample Expiration 04/24/2017    Final                   Value:04/27/2017 Performed at [facility], [place] [address], [place], [place] [address]        Ardath Sax, MD

## 2017-05-14 NOTE — Assessment & Plan Note (Signed)
84 y.o. male with diagnosis of Sjogren's syndrome referred to the clinic for bicytopenia with leukopenia and anemia.  Review of the hematological trends demonstrates progressive neutropenia and lymphopenia with associated normocytic normochromic anemia with trends towards macrocytosis.  Differential diagnosis is broad and includes development of myelodysplasia, acute myeloid leukemia, or development of systemic autoimmune condition such as systemic lupus.   Patient underwent bone marrow biopsy which demonstrates abnormal bone marrow with hypercellular tissue with increased granulocytic precursors without evidence of increased blasts.  At this time, this rules out acute myelogenous leukemia.  Nevertheless, the abnormalities, could be attributed to myelodysplastic syndrome or known autoimmune condition of Sjogren's syndrome.  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  Such translocations are frequently associated with myeloproliferative or myelodysplastic processes supporting the diagnosis of myelodysplastic syndrome.  At this time, white blood cell count has improved slightly with lymphocyte count returning back to normal and patient having mild neutropenia.  Since the last visit to the clinic, there has been an interval decline in the hemoglobin level now approaching transfusion threshold.  Plan: -At this time, we need to make decision whether to proceed with supportive care using erythropoietin and G-CSF versus initiation of direct therapy with decitabine versus continued observation and supportive care with interval transfusions. -Weekly hemoglobin check, transfuse 1 unit of leuko-reduced and irradiated packed red blood cells if hemoglobin is less or equal to 8.0  g/dL. -Patient is to start B6 vitamin replacement with use of stress B complex. -Return to clinic in 4 weeks for continued hematological monitoring.

## 2017-05-15 ENCOUNTER — Other Ambulatory Visit: Payer: Medicare Other

## 2017-05-20 ENCOUNTER — Other Ambulatory Visit: Payer: Medicare Other

## 2017-05-22 ENCOUNTER — Ambulatory Visit: Payer: Medicare Other | Admitting: Hematology and Oncology

## 2017-05-22 ENCOUNTER — Other Ambulatory Visit: Payer: Medicare Other

## 2017-05-24 ENCOUNTER — Telehealth: Payer: Self-pay | Admitting: Medical Oncology

## 2017-05-24 NOTE — Telephone Encounter (Signed)
Pt died 05/12/2017.

## 2019-11-09 IMAGING — CT CT ANGIO CHEST-ABD-PELV FOR DISSECTION W/ AND WO/W CM
2 of 8 series · 12 of 46 positions shown, 14 images · IV contrast (OMNI)
Comparison: None

CLINICAL DATA: Evaluate for aortic dissection.

EXAM:
CT ANGIOGRAPHY CHEST, ABDOMEN AND PELVIS
TECHNIQUE: Multidetector CT imaging through the chest, abdomen and pelvis was
performed using the standard protocol during bolus administration of
intravenous contrast. Multiplanar reconstructed images and MIPs were
obtained and reviewed to evaluate the vascular anatomy.
CONTRAST:  100mL HD3DZ5-JGV IOPAMIDOL (HD3DZ5-JGV) INJECTION 76%

[Series 6: dissection 3.0 i30f 3 · axial · 0.91mm/px · z∈[+782,+1352]mm · 9 of 229 slices shown, 11 images]
[im 20/229  soft-tissue]
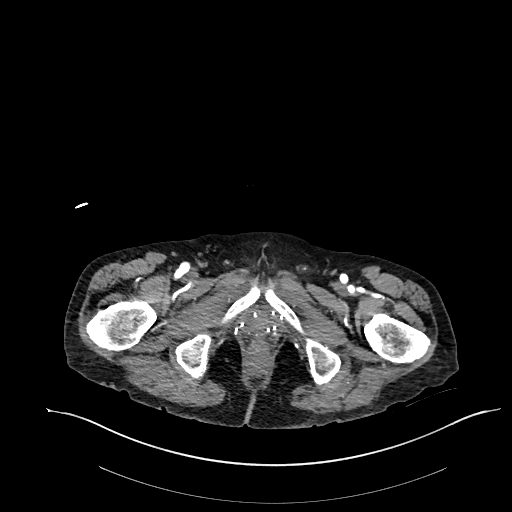
[im 20/229  bone]
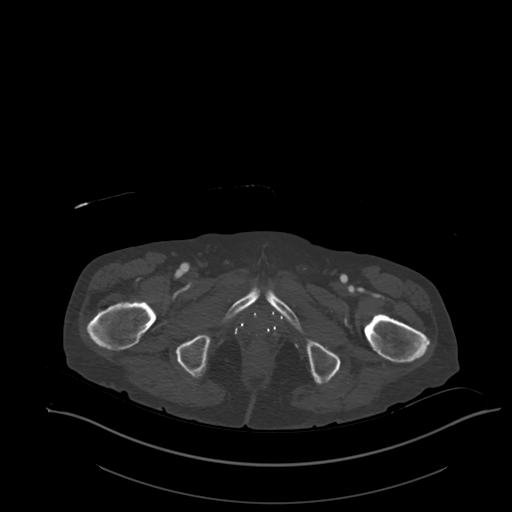
[im 39/229  soft-tissue]
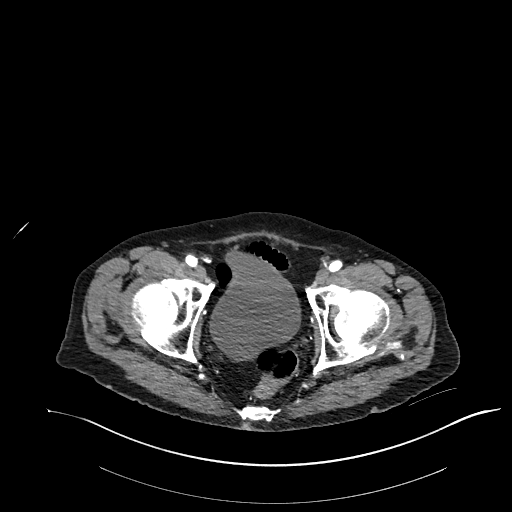
[im 67/229  soft-tissue]
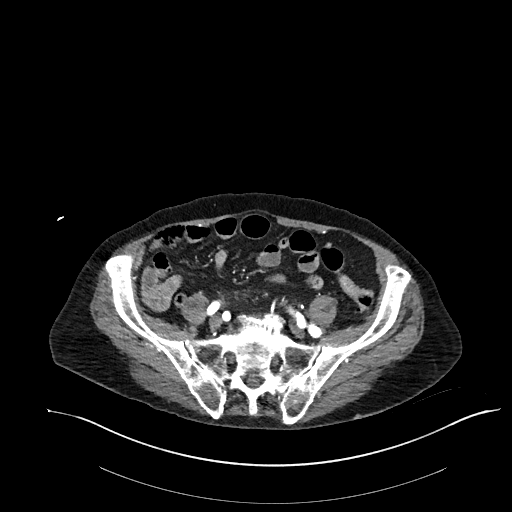
[im 86/229  soft-tissue]
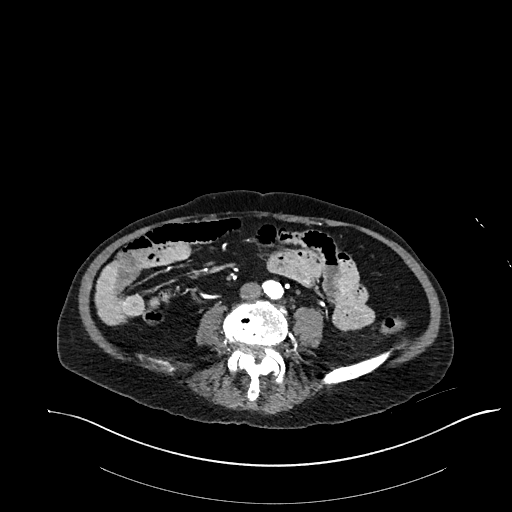
[im 115/229  soft-tissue]
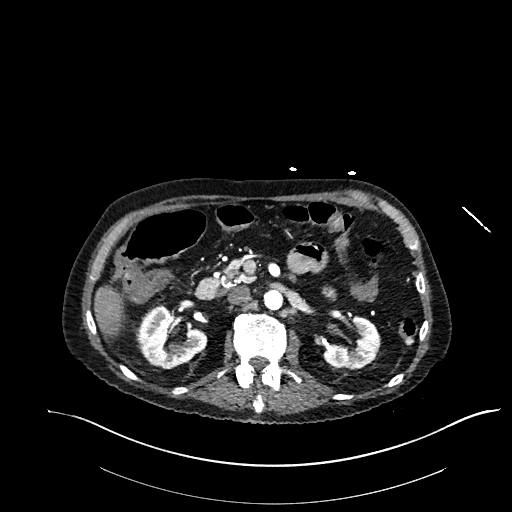
[im 143/229  soft-tissue]
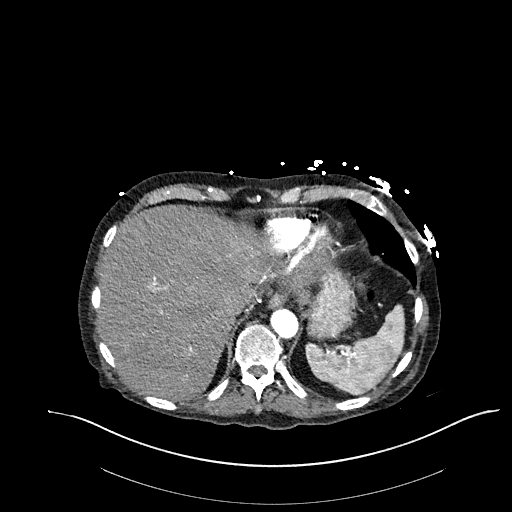
[im 162/229  soft-tissue]
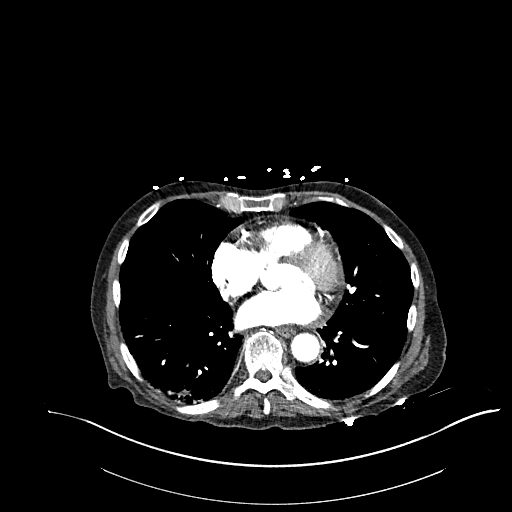
[im 191/229  soft-tissue]
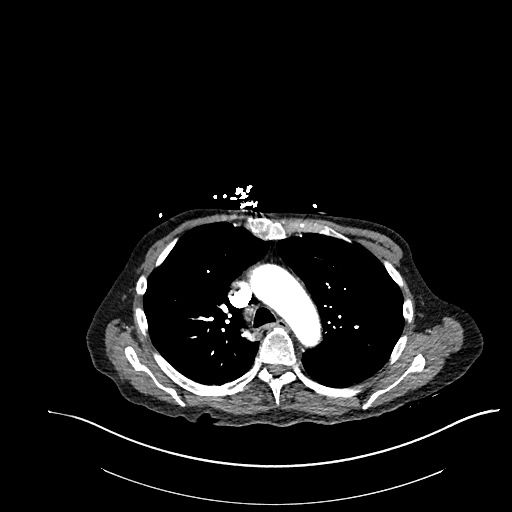
[im 210/229  soft-tissue]
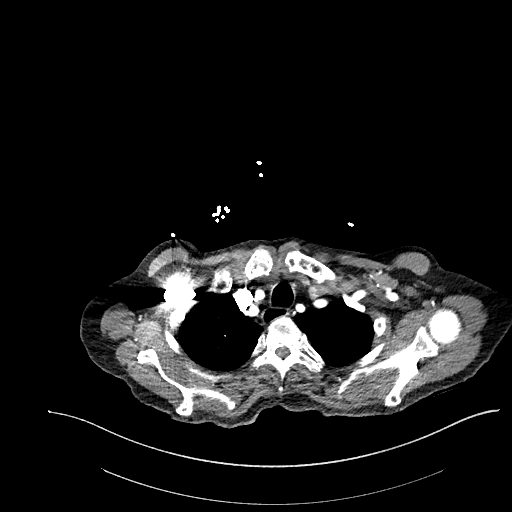
[im 210/229  bone]
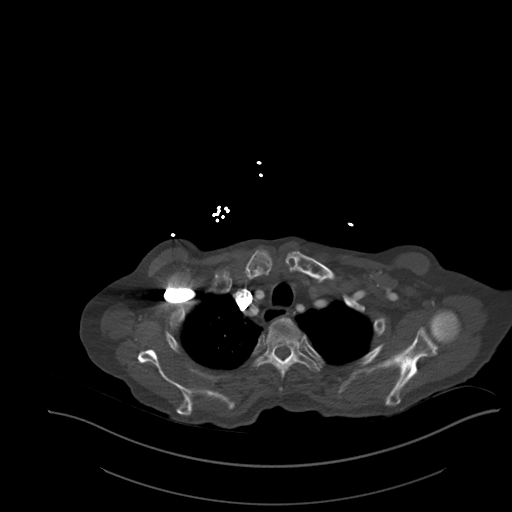

[Series 9: coronals · coronal · 0.69mm/px · 3 of 145 slices shown]
[im 37/145  soft-tissue]
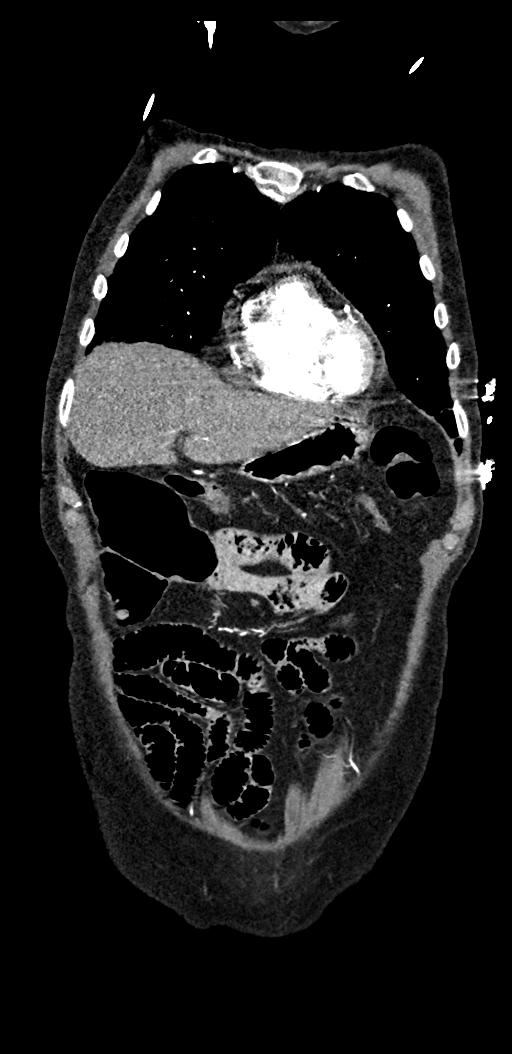
[im 73/145  soft-tissue]
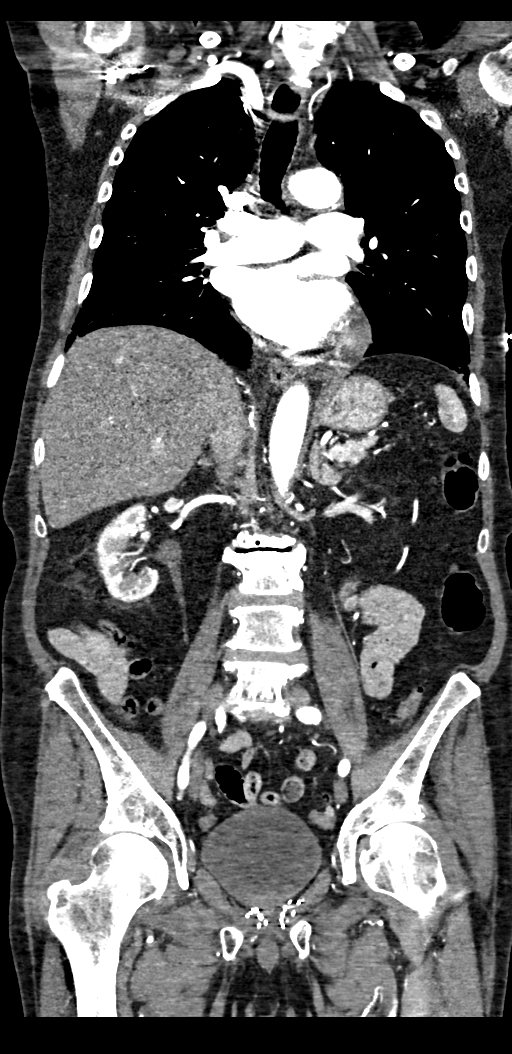
[im 109/145  soft-tissue]
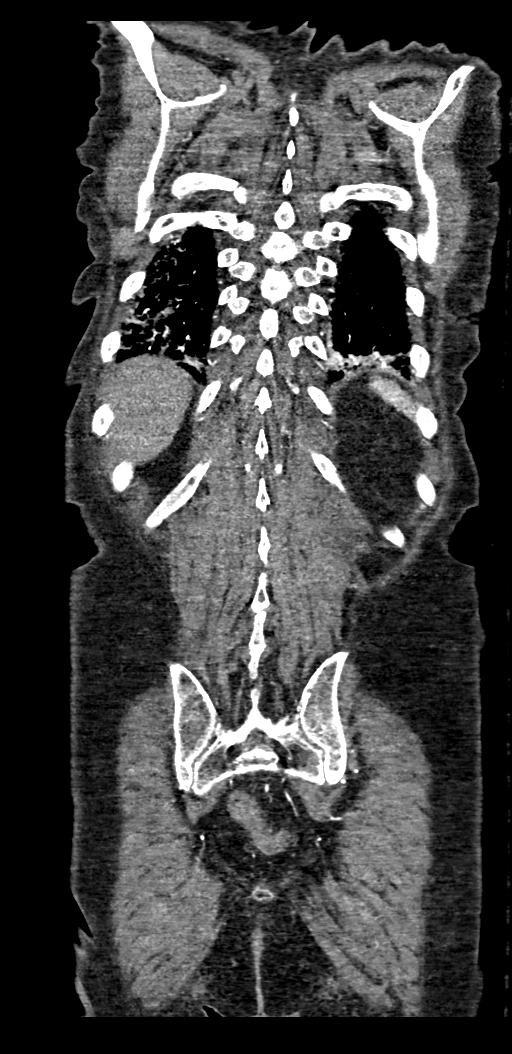

[12 of 46 positions shown; findings below may reference images not displayed]

FINDINGS: CTA CHEST FINDINGS

Cardiovascular: The heart size is normal. There is aortic
atherosclerosis. Calcification within the LAD coronary artery
identified.

No evidence for thoracic aortic dissection or aneurysm.

The main pulmonary artery is patent. No central obstructing embolus
or saddle embolus identified.

Mediastinum/Nodes: No enlarged mediastinal, hilar, or axillary lymph
nodes. Thyroid gland, trachea, and esophagus demonstrate no
significant findings.

Lungs/Pleura: Mild changes of emphysema. No pleural effusions.
Dependent changes identified overlying the lung bases. No
pneumothorax. Calcified granuloma identified in the right middle
lobe. Small ground-glass attenuating nodule in the right middle lobe
measures 5 mm.

Musculoskeletal: Spondylosis noted within the thoracic spine.

Review of the MIP images confirms the above findings.

CTA ABDOMEN AND PELVIS FINDINGS

VASCULAR

Aorta: Aortic atherosclerosis. No evidence for dissection. No
aneurysm.

Celiac: Patent. Calcification and narrowing at the origin of the
celiac artery is identified, image 97 of series 10.

SMA: Patent. Atherosclerotic calcifications and narrowing at the
origin of the superior mesenteric artery noted.

Renals: Renal arteries are patent. There is atherosclerotic
calcification and mild narrowing involving the proximal renal
arteries.

IMA: Patent without evidence of aneurysm, dissection, vasculitis or
significant stenosis.

Inflow: Patent without evidence of aneurysm, dissection, vasculitis
or significant stenosis.

Veins: No obvious venous abnormality within the limitations of this
arterial phase study.

Review of the MIP images confirms the above findings.

NON-VASCULAR

Hepatobiliary: No focal liver abnormality is seen. Status post
cholecystectomy. No biliary dilatation.

Pancreas: Unremarkable. No pancreatic ductal dilatation or
surrounding inflammatory changes.

Spleen: Normal in size without focal abnormality.

Adrenals/Urinary Tract: Normal adrenal glands.

Mild bilateral renal cortical thinning identified. Lower pole cyst
within the left kidney is identified measuring approximately 1.4 cm.
No mass or hydronephrosis. The urinary bladder appears normal.

Stomach/Bowel: The stomach is normal. The small bowel loops have a
normal course and caliber. There is no pathologic dilatation of the
colon. Numerous colonic diverticula noted without acute
inflammation.

Lymphatic: No enlarged upper abdominal lymph nodes. No pelvic or
inguinal adenopathy.

Reproductive: Seed implants are identified within the prostate
gland.

Other: There is no ascites or focal fluid collections within the
abdomen or pelvis.

Musculoskeletal: There is spondylosis noted throughout the lumbar
spine. No suspicious bone lesions.

Review of the MIP images confirms the above findings.
IMPRESSION: 1. No evidence for aortic dissection.
2. Aortic atherosclerosis with atherosclerotic calcifications in the
LAD coronary artery. Atherosclerotic calcifications with narrowing
involving the origins of the celiac artery, SMA and renal arteries.
3. Right middle lobe ground-glass attenuating nodule. This measures
5 mm. No follow-up recommended. This recommendation follows the
consensus statement: Guidelines for Management of Incidental
Pulmonary Nodules Detected on CT Images: From the [HOSPITAL]
